# Patient Record
Sex: Male | Born: 1970 | Race: White | Hispanic: No | Smoking: Never smoker
Health system: Southern US, Community
[De-identification: ages and names within clinical notes are randomized; demographics above are authoritative.]

## PROBLEM LIST (undated history)

## (undated) DIAGNOSIS — K4090 Unilateral inguinal hernia, without obstruction or gangrene, not specified as recurrent: Secondary | ICD-10-CM

## (undated) DIAGNOSIS — N200 Calculus of kidney: Secondary | ICD-10-CM

## (undated) DIAGNOSIS — J45909 Unspecified asthma, uncomplicated: Secondary | ICD-10-CM

## (undated) DIAGNOSIS — K219 Gastro-esophageal reflux disease without esophagitis: Secondary | ICD-10-CM

## (undated) HISTORY — DX: Gastro-esophageal reflux disease without esophagitis: K21.9

## (undated) HISTORY — PX: CYSTOSCOPY: SUR368

## (undated) HISTORY — PX: TONSILLECTOMY: SUR1361

## (undated) HISTORY — DX: Unspecified asthma, uncomplicated: J45.909

## (undated) HISTORY — PX: SKIN BIOPSY: SHX1

## (undated) HISTORY — DX: Calculus of kidney: N20.0

## (undated) HISTORY — DX: Unilateral inguinal hernia, without obstruction or gangrene, not specified as recurrent: K40.90

---

## 2005-10-16 ENCOUNTER — Encounter: Admission: RE | Admit: 2005-10-16 | Discharge: 2005-10-16 | Payer: Self-pay | Admitting: Family Medicine

## 2006-03-09 ENCOUNTER — Ambulatory Visit (HOSPITAL_BASED_OUTPATIENT_CLINIC_OR_DEPARTMENT_OTHER): Admission: RE | Admit: 2006-03-09 | Discharge: 2006-03-09 | Payer: Self-pay | Admitting: General Surgery

## 2006-03-09 ENCOUNTER — Encounter (INDEPENDENT_AMBULATORY_CARE_PROVIDER_SITE_OTHER): Payer: Self-pay | Admitting: Specialist

## 2008-03-23 ENCOUNTER — Encounter: Admission: RE | Admit: 2008-03-23 | Discharge: 2008-03-23 | Payer: Self-pay | Admitting: Family Medicine

## 2011-01-08 ENCOUNTER — Encounter: Payer: Self-pay | Admitting: Family Medicine

## 2011-05-05 NOTE — Op Note (Signed)
NAMEKOLIN, Tyler Nelson            ACCOUNT NO.:  [id number]   MEDICAL RECORD NO.:  [id number]          PATIENT TYPE:  AMB   LOCATION:  DSC                          FACILITY:  [facility]   PHYSICIAN:  Anselm Pancoast. Weatherly, M.D.DATE OF BIRTH:  [birth year]   DATE OF PROCEDURE:  03/09/2006  DATE OF DISCHARGE:                                 OPERATIVE REPORT   PREOPERATIVE DIAGNOSIS:  Multiple lipomas.   POSTOPERATIVE DIAGNOSIS:  Multiple lipomas.   OPERATION:  Excision of multiple lipomas.  There is one on the lumbar area  of the back that may actually be a little sebaceous cyst. There was a larger  one about 3 cm on the right lateral chest, 2 cm right sternum, 2 cm anterior  right thigh, 2 cm right posterior thigh.   Tyler Nelson is a 40 year old male from whom I have removed previous  lipomas probably about five years ago.  He presented to the office with  multiple lipomas.  His family history, his mother has had many lipomas  excised by other surgeons.  He desired these to be removed and he was  originally scheduled for yesterday, but because of conflicting schedule he  returns today.  He had marked all of these various areas and the small one  on the back he said was the most symptomatic, but was not as large and I  elected to remove this one first.  Using a Betadine prep and then  anesthetizing the area with 1% Xylocaine with adrenaline, I made an incision  of the area and I think this one possibly was a little sebaceous cyst in  that it was more granular than the typical lipoma.  I then closed the  incision with simple sutures of 4-0 nylon.  Then we went to the right  lateral chest which is below the ribs which was larger and I anesthetized  this with about 5 or 6 mL of Xylocaine and then that was much larger and it  was actually two and it required suturing of figure-of-eight of 3-0 chromic.  The area was then closed with simple sutures of 4-0 nylon and it is a much  larger  defect.  Then there was a little small, 2 cm, just to the right of  the xiphoid that was removed in a similar manner and closed with 4-0 nylon.  There were two 2 cm areas, one anterior right thigh and one right posterior,  that was excised in the same manner. Betadine prep, Xylocaine and then  closed the skin.  The patient tolerated the procedure nicely and will be  released after a short stay.  I will see him in the office in probably about  7 to 10 days for suture removal.  He will be given Vicodin for pain.  I did  send the lesion on the back that may be a little sebaceous cyst separately  since it had a different appearance that the obvious lipomas that appeared  benign in the other areas.           ______________________________  Anselm Pancoast. Zachery Dakins, M.D.  WJW/MEDQ  D:  03/09/2006  T:  03/12/2006  Job:  [id number]

## 2015-02-05 ENCOUNTER — Ambulatory Visit (INDEPENDENT_AMBULATORY_CARE_PROVIDER_SITE_OTHER): Payer: Worker's Compensation | Admitting: Medical

## 2015-02-05 ENCOUNTER — Encounter: Payer: Self-pay | Admitting: Medical

## 2015-02-05 VITALS — BP 122/90 | HR 63 | Temp 97.7°F | Resp 16 | Ht 67.0 in | Wt 170.0 lb

## 2015-02-05 DIAGNOSIS — M545 Low back pain, unspecified: Secondary | ICD-10-CM

## 2015-02-05 DIAGNOSIS — M542 Cervicalgia: Secondary | ICD-10-CM

## 2015-02-05 MED ORDER — NAPROXEN 375 MG PO TABS: 375.0000 mg | ORAL_TABLET | Freq: Two times a day (BID) | ORAL | Status: DC

## 2015-02-05 MED ORDER — CYCLOBENZAPRINE HCL 10 MG PO TABS: ORAL_TABLET | ORAL | Status: DC
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
## 2015-02-25 ENCOUNTER — Other Ambulatory Visit: Payer: Self-pay | Admitting: Medical

## 2015-02-25 ENCOUNTER — Telehealth: Payer: Self-pay | Admitting: Medical

## 2015-02-25 ENCOUNTER — Ambulatory Visit (INDEPENDENT_AMBULATORY_CARE_PROVIDER_SITE_OTHER): Payer: Worker's Compensation | Admitting: Medical

## 2015-02-25 ENCOUNTER — Encounter: Payer: Self-pay | Admitting: Medical

## 2015-02-25 VITALS — BP 118/78 | HR 72 | Temp 97.8°F | Resp 15 | Wt 169.0 lb

## 2015-02-25 DIAGNOSIS — L609 Nail disorder, unspecified: Secondary | ICD-10-CM | POA: Diagnosis not present

## 2015-02-25 DIAGNOSIS — M545 Low back pain: Secondary | ICD-10-CM | POA: Diagnosis not present

## 2015-02-25 DIAGNOSIS — M542 Cervicalgia: Secondary | ICD-10-CM | POA: Diagnosis not present

## 2015-02-25 MED ORDER — CYCLOBENZAPRINE HCL 10 MG PO TABS: ORAL_TABLET | ORAL | Status: DC

## 2015-02-25 MED ORDER — NAPROXEN 375 MG PO TABS: 375.0000 mg | ORAL_TABLET | Freq: Two times a day (BID) | ORAL | Status: DC

## 2015-02-25 NOTE — Telephone Encounter (Signed)
Refer to dermatology.   Right thumb nail deformity since mashing in car door 3 mo ago.
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
## 2015-03-02 NOTE — Telephone Encounter (Signed)
Patient has an appointment at [facility] on 03/22/15 @ 240 pm with Marikay Alareana  [place] PA [address] [place], [place] [phone]

## 2015-03-04 NOTE — Telephone Encounter (Signed)
LM to [facility] 

## 2015-05-05 ENCOUNTER — Telehealth: Payer: Self-pay | Admitting: Internal Medicine

## 2015-05-05 NOTE — Telephone Encounter (Signed)
Faxed over medical records on 04/22/15 and mailed on  04/23/15 to [facility] [employer] @ [phone]

## 2015-06-16 ENCOUNTER — Encounter: Payer: Self-pay | Admitting: Medical

## 2015-07-12 ENCOUNTER — Telehealth: Payer: Self-pay | Admitting: Medical

## 2015-07-12 ENCOUNTER — Encounter: Payer: Self-pay | Admitting: Medical

## 2015-07-12 ENCOUNTER — Ambulatory Visit (INDEPENDENT_AMBULATORY_CARE_PROVIDER_SITE_OTHER): Payer: Worker's Compensation | Admitting: Medical

## 2015-07-12 ENCOUNTER — Ambulatory Visit
Admission: RE | Admit: 2015-07-12 | Discharge: 2015-07-12 | Disposition: A | Discharge status: Home / Self Care | Payer: Worker's Compensation | Source: Ambulatory Visit | Attending: Medical | Admitting: Medical

## 2015-07-12 VITALS — BP 110/80 | HR 72 | Temp 98.0°F | Resp 15 | Wt 166.0 lb

## 2015-07-12 DIAGNOSIS — M545 Low back pain: Secondary | ICD-10-CM

## 2015-07-12 DIAGNOSIS — R8290 Unspecified abnormal findings in urine: Secondary | ICD-10-CM

## 2015-07-12 DIAGNOSIS — M546 Pain in thoracic spine: Secondary | ICD-10-CM | POA: Diagnosis not present

## 2015-07-12 DIAGNOSIS — M549 Dorsalgia, unspecified: Secondary | ICD-10-CM

## 2015-07-12 LAB — POCT URINALYSIS DIPSTICK
BILIRUBIN UA: NEGATIVE
Blood, UA: NEGATIVE
Glucose, UA: NEGATIVE
KETONES UA: NEGATIVE
Leukocytes, UA: NEGATIVE
Nitrite, UA: NEGATIVE
Protein, UA: NEGATIVE
SPEC GRAV UA: 1.015
Urobilinogen, UA: NEGATIVE
pH, UA: 8

## 2015-07-12 MED ORDER — NAPROXEN 375 MG PO TABS: 375.0000 mg | ORAL_TABLET | Freq: Two times a day (BID) | ORAL | Status: DC

## 2015-07-12 MED ORDER — CYCLOBENZAPRINE HCL 10 MG PO TABS: ORAL_TABLET | ORAL | Status: DC

## 2015-07-12 MED ORDER — KETOROLAC TROMETHAMINE 60 MG/2ML IM SOLN
60.0000 mg | Freq: Once | INTRAMUSCULAR | Status: AC
  Administered 2015-07-12: 60 mg via INTRAMUSCULAR

## 2015-07-12 NOTE — Addendum Note (Signed)
Addended by: Jac Canavan on: 07/12/2015 11:45 AM   Modules accepted: Orders

## 2015-07-12 NOTE — Telephone Encounter (Signed)
Referral to physical therapy. 
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
## 2015-07-12 NOTE — Addendum Note (Signed)
Addended by: Leretha Dykes L on: 07/12/2015 10:40 AM   Modules accepted: Orders

## 2015-07-13 NOTE — Telephone Encounter (Signed)
[facility] - [facility]  Directions  Physical Therapy Clinic  Address: [address] [place], [place], [place] [address]  Phone: [phone]

## 2015-07-13 NOTE — Telephone Encounter (Signed)
Done

## 2015-07-14 LAB — URINE CULTURE

## 2015-08-11 ENCOUNTER — Ambulatory Visit: Payer: Worker's Compensation | Admitting: Medical

## 2015-09-29 ENCOUNTER — Telehealth: Payer: Self-pay

## 2015-09-29 NOTE — Telephone Encounter (Signed)
Medical Records sent [facility] to [facility] on 09/29/15

## 2015-09-29 NOTE — Telephone Encounter (Signed)
Medical records that were sent out on him today. The order for these was cancelled by the requestor.

## 2015-11-19 ENCOUNTER — Ambulatory Visit (INDEPENDENT_AMBULATORY_CARE_PROVIDER_SITE_OTHER): Payer: BLUE CROSS/BLUE SHIELD | Admitting: Medical

## 2015-11-19 ENCOUNTER — Encounter: Payer: Self-pay | Admitting: Medical

## 2015-11-19 VITALS — BP 120/88 | Temp 97.6°F | Wt 171.0 lb

## 2015-11-19 DIAGNOSIS — J0110 Acute frontal sinusitis, unspecified: Secondary | ICD-10-CM

## 2015-11-19 DIAGNOSIS — J988 Other specified respiratory disorders: Secondary | ICD-10-CM | POA: Diagnosis not present

## 2015-11-19 MED ORDER — AMOXICILLIN 500 MG PO TABS: ORAL_TABLET | ORAL | Status: DC

## 2015-11-19 MED ORDER — METHYLPREDNISOLONE ACETATE 40 MG/ML IJ SUSP
40.0000 mg | Freq: Once | INTRAMUSCULAR | Status: AC
  Administered 2015-11-19: 40 mg via INTRAMUSCULAR

## 2015-11-19 NOTE — Patient Instructions (Signed)
Medications prescribed today: Amoxicillin antibiotic Depo Medrol steroid injection given  Thank you for giving me the opportunity to serve you today.   Your diagnosis today includes: Encounter Diagnoses  Name Primary?  . Acute frontal sinusitis, recurrence not specified Yes  . Respiratory tract infection    Specific home care recommendations today include:  Only take over-the-counter (OTC) or prescription medicines for pain, discomfort, or fever as directed by your caregiver.    Decongestant: You may use OTC Guaifenesin (Mucinex plain) for congestion.  You may use Pseudoephedrine (Sudafed) only if you don't have blood pressure problems or a diagnosis of hypertension.  Cough suppression: If you have cough from drainage, you may use over-the-counter Dextromethorphan (Delsym) as directed on the label  Pain/fever relief: You may use over-the-counter Tylenol for pain or fever  Drink extra fluids. Fluids help thin the mucus so your sinuses can drain more easily.   Applying either moist heat or ice packs to the sinus areas may help relieve discomfort.  Use saline nasal sprays to help moisten your sinuses. The sprays can be found at your local drugstore.   Using Saline Nose Drops with Bulb Syringe A bulb syringe is used to clear your nose. You may use it when you have a stuffy nose, nasal congestion, sinus pressure, or sneezing.   SALINE SOLUTION You can buy nose drops at your local drug store. You can also make nose drops yourself. Mix 1 cup of water with  teaspoon of salt. Stir. Store this mixture at room temperature. Make a new batch daily.  USE THE BULB IN COMBINATION WITH SALINE NOSE DROPS  Squeeze the air out of the bulb before suctioning the saline mixture.  While still squeezing the bulb flat, place the tip of the bulb into the saline mixture.  Let air come back into the bulb.  This will suction up the saline mixture.  Gently flush one nostril at a time.  Salt water nose  drops will then moisten your  congested nose and loosen secretions before suctioning.  Use the bulb syringe as directed below to suction.  USING THE BULB SYRINGE TO SUCTION  While still squeezing the bulb flat, place the tip of the bulb into a nostril. Let air come back into the bulb. The suction will pull snot out of the nose and into the bulb.  Repeat on the other nostril.  Squeeze syringe several times into a tissue.  CLEANING THE BULB SYRINGE  Clean the bulb syringe every day with hot soapy water.  Clean the inside of the bulb by squeezing the bulb while the tip is in soapy water.  Rinse by squeezing the bulb while the tip is in clean hot water.  Store the bulb with the tip side down on paper towel.  HOME CARE INSTRUCTIONS   Use saline nose drops often to keep the nose open and not stuffy.  Throw away used salt water. Make a new solution every time.  Do not use the same solution and dropper for another person  If you do not prefer to use nasal saline flush, other options include nasal saline spray or the [employer], both of which are available over the counter at your pharmacy.   Please call or return if worse or not improving in the next few days.    Medication costs:  If you get to the pharmacy and medication prescribed today was either too expensive, not covered by your insurance, or required prior authorization, then please call us  back to let us know.  We often have no way to know if a medication is too expensive or not covered by your insurance.  Thanks for your cooperation.   Return if symptoms worsen or fail to improve.

## 2015-11-19 NOTE — Progress Notes (Signed)
Subjective:  Tyler Nelson is a 44 y.o. male who presents for congestion. Has had respiratory infection for 1+ week.  He notes head and chest congestion, some runny nose, some sneezing, stuffy, less appetite.   Maybe feels a little dyspneic but no wheezing or SOB.  Denies sore throat, ear pain, fever, nausea, vomiting.   Has some body aches.   He notes the past 1-2 years will get shot for respiratory infection.  Using OTC DM cough med, nyquil some.  Nonsmoker.  Has sick contacts.  No other aggravating or relieving factors.  No other c/o.  History reviewed. No pertinent past medical history.  ROS as in subjective   Objective: BP 120/88 mmHg  Temp(Src) 97.6 F (36.4 C) (Oral)  Wt 171 lb (77.565 kg)  General appearance: Alert, WD/WN, no distress                             Skin: warm, no rash                           Head: + frontal sinus tenderness,                            Eyes: conjunctiva normal, corneas clear, PERRLA                            Ears: flat left TM, pearly right TM, external ear canals normal                          Nose: septum midline, turbinates swollen, with erythema and clear discharge             Mouth/throat: MMM, tongue normal, mild pharyngeal erythema                           Neck: supple, no adenopathy, no thyromegaly, non tender                         Lungs: CTA bilaterally, no wheezes, rales, or rhonchi      Assessment  Encounter Diagnoses  Name Primary?  . Acute frontal sinusitis, recurrence not specified Yes  . Respiratory tract infection       Plan: Begin amoxicillin.   IM DepoMedrol 40mg  given in office.  discussed risks/benefits of medication.    Specific home care recommendations today include:  Only take over-the-counter (OTC) or prescription medicines for pain, discomfort, or fever as directed by your caregiver.    Decongestant: You may use OTC Guaifenesin (Mucinex plain) for congestion.  You may use Pseudoephedrine (Sudafed) only  if you don't have blood pressure problems or a diagnosis of hypertension.  Cough suppression: If you have cough from drainage, you may use over-the-counter Dextromethorphan (Delsym) as directed on the label  Pain/fever relief: You may use over-the-counter Tylenol for pain or fever  Drink extra fluids. Fluids help thin the mucus so your sinuses can drain more easily.   Applying either moist heat or ice packs to the sinus areas may help relieve discomfort.  Use saline nasal sprays to help moisten your sinuses. The sprays can be found at your local drugstore.   [name] was seen today for congestion.  Diagnoses and all orders for  this visit:  Acute frontal sinusitis, recurrence not specified  Respiratory tract infection  Other orders -     amoxicillin (AMOXIL) 500 MG tablet; 2 tablets po BID x 10 days   Patient was advised to call or return if worse or not improving in the next few days.    Patient voiced understanding of diagnosis, recommendations, and treatment plan.  After visit summary given.

## 2015-11-19 NOTE — Addendum Note (Signed)
Addended by: Kieth BrightlyLAWSON, Amaria Mundorf M on: 11/19/2015 09:37 AM   Modules accepted: Orders

## 2016-05-30 ENCOUNTER — Encounter: Payer: BLUE CROSS/BLUE SHIELD | Admitting: Medical

## 2016-05-30 DIAGNOSIS — Z0000 Encounter for general adult medical examination without abnormal findings: Secondary | ICD-10-CM

## 2016-06-02 ENCOUNTER — Encounter: Payer: Self-pay | Admitting: Medical

## 2016-06-02 ENCOUNTER — Telehealth: Payer: Self-pay

## 2016-06-02 NOTE — Telephone Encounter (Signed)
LMTCB
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
## 2016-06-02 NOTE — Telephone Encounter (Signed)
Send no show fee and D

## 2016-06-02 NOTE — Telephone Encounter (Signed)
[facility]

## 2016-06-05 NOTE — Telephone Encounter (Signed)
LMTCB

## 2016-07-03 ENCOUNTER — Telehealth: Payer: Self-pay | Admitting: Family Medicine

## 2016-07-03 NOTE — Telephone Encounter (Signed)
Pt came in to explain why he missed his last appt.  He states he tried to call us 2 different times to reschedule and couldn't leave message.  We rescheduled his CPE.

## 2016-08-04 ENCOUNTER — Encounter: Payer: Self-pay | Admitting: Medical

## 2016-08-04 ENCOUNTER — Ambulatory Visit (INDEPENDENT_AMBULATORY_CARE_PROVIDER_SITE_OTHER): Payer: BLUE CROSS/BLUE SHIELD | Admitting: Medical

## 2016-08-04 VITALS — BP 120/70 | HR 72 | Ht 66.5 in | Wt 167.0 lb

## 2016-08-04 DIAGNOSIS — Z23 Encounter for immunization: Secondary | ICD-10-CM | POA: Diagnosis not present

## 2016-08-04 DIAGNOSIS — Z0000 Encounter for general adult medical examination without abnormal findings: Secondary | ICD-10-CM | POA: Insufficient documentation

## 2016-08-04 LAB — LIPID PANEL
CHOL/HDL RATIO: 3.3 ratio (ref <=5.0)
Cholesterol: 198 mg/dL (ref 125–200)
HDL: 60 mg/dL (ref >=40)
LDL Cholesterol: 124 mg/dL (ref <130)
Triglycerides: 68 mg/dL (ref <150)
VLDL: 14 mg/dL (ref <30)

## 2016-08-04 LAB — COMPREHENSIVE METABOLIC PANEL
ALT: 17 U/L (ref 9–46)
AST: 19 U/L (ref 10–40)
Albumin: 4.2 g/dL (ref 3.6–5.1)
Alkaline Phosphatase: 46 U/L (ref 40–115)
BUN: 12 mg/dL (ref 7–25)
CO2: 29 mmol/L (ref 20–31)
Calcium: 9.6 mg/dL (ref 8.6–10.3)
Chloride: 103 mmol/L (ref 98–110)
Creat: 0.95 mg/dL (ref 0.60–1.35)
Glucose, Bld: 81 mg/dL (ref 65–99)
Potassium: 4.2 mmol/L (ref 3.5–5.3)
Sodium: 143 mmol/L (ref 135–146)
Total Bilirubin: 0.6 mg/dL (ref 0.2–1.2)
Total Protein: 6.6 g/dL (ref 6.1–8.1)

## 2016-08-04 LAB — CBC
HEMATOCRIT: 45.6 % (ref 38.5–50.0)
Hemoglobin: 15.3 g/dL (ref 13.2–17.1)
MCH: 31.2 pg (ref 27.0–33.0)
MCHC: 33.6 g/dL (ref 32.0–36.0)
MCV: 93.1 fL (ref 80.0–100.0)
MPV: 9.6 fL (ref 7.5–12.5)
Platelets: 290 10*3/uL (ref 140–400)
RBC: 4.9 MIL/uL (ref 4.20–5.80)
RDW: 14 % (ref 11.0–15.0)
WBC: 6.6 10*3/uL (ref 4.0–10.5)

## 2016-08-04 LAB — POCT URINALYSIS DIPSTICK
Bilirubin, UA: NEGATIVE
Blood, UA: NEGATIVE
Glucose, UA: NEGATIVE
Ketones, UA: NEGATIVE
Leukocytes, UA: NEGATIVE
Nitrite, UA: NEGATIVE
Protein, UA: NEGATIVE
Spec Grav, UA: 1.02
UROBILINOGEN UA: NEGATIVE
pH, UA: 7

## 2016-08-04 LAB — TSH: TSH: 1.64 mIU/L (ref 0.40–4.50)

## 2016-08-04 NOTE — Addendum Note (Signed)
Addended by: Kieth BrightlyLAWSON, Patryck Kilgore M on: 08/04/2016 09:51 AM   Modules accepted: Orders

## 2016-08-04 NOTE — Progress Notes (Signed)
Subjective:   HPI  Tyler Nelson is a 45 y.o. male who presents for a complete physical.  Medical care team includes:  Dentist  Ernst BreachYSINGER, DAVID SHANE, PA-C here for primary care   Concerns: Last tetanus around 2011  Reviewed their medical, surgical, family, social, medication, and allergy history and updated chart as appropriate.  Past Medical History:  Diagnosis Date  . GERD (gastroesophageal reflux disease)   . Inguinal hernia   . Reactive airway disease    prior occasional albuterol use, but no definite asthma history    Past Surgical History:  Procedure Laterality Date  . TONSILLECTOMY      Social History   Social History  . Marital status: Divorced    Spouse name: N/A  . Number of children: N/A  . Years of education: N/A   Occupational History  . Not on file.   Social History Main Topics  . Smoking status: Never Smoker  . Smokeless tobacco: Never Used  . Alcohol use No  . Drug use: No  . Sexual activity: Not on file   Other Topics Concern  . Not on file   Social History Narrative   Separated, has 3 adopted kids from [place].  Exercise - walking, always on the move.  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    Family History  Problem Relation Age of Onset  . Hyperlipidemia Mother   . Hypertension Father   . Diabetes Maternal Grandmother   . Cancer Maternal Grandfather     leukemia  . Heart disease Maternal Grandfather 70  . Cancer Paternal Grandfather [phone]    prostate  . Stroke Neg Hx     No current outpatient prescriptions on file.  Allergies  Allergen Reactions  . Codeine     Review of Systems Constitutional: -fever, -chills, -sweats, -unexpected weight change, -decreased appetite, -fatigue Allergy: -sneezing, -itching, -congestion Dermatology: -changing moles, --rash, -lumps ENT: -runny nose, -ear pain, -sore throat, -hoarseness, -sinus pain, -teeth pain, - ringing in ears, -hearing loss, -nosebleeds Cardiology: -chest pain, -palpitations,  -swelling, -difficulty breathing when lying flat, -waking up short of breath Respiratory: -cough, -shortness of breath, -difficulty breathing with exercise or exertion, -wheezing, -coughing up blood Gastroenterology: -abdominal pain, -nausea, -vomiting, -diarrhea, -constipation, -blood in stool, -changes in bowel movement, -difficulty swallowing or eating Hematology: -bleeding, -bruising  Musculoskeletal: -joint aches, -muscle aches, -joint swelling, -back pain, -neck pain, -cramping, -changes in gait Ophthalmology: denies vision changes, eye redness, itching, discharge Urology: -burning with urination, -difficulty urinating, -blood in urine, -urinary frequency, -urgency, -incontinence Neurology: -headache, -weakness, -tingling, -numbness, -memory loss, -falls, -dizziness Psychology: -depressed mood, -agitation, -sleep problems     Objective:   Physical Exam  BP 120/70   Pulse 72   Ht 5' 6.5" (1.689 m)   Wt 167 lb (75.8 kg)   BMI 26.55 kg/m   General appearance: alert, no distress, WD/WN, white male Skin: scattered macules, no worrisome lesions.   Right lateral flank area with 3cm linear biopsy scar HEENT: normocephalic, conjunctiva/corneas normal, sclerae anicteric, PERRLA, EOMi, nares patent, no discharge or erythema, pharynx normal Oral cavity: MMM, tongue normal, teeth normal Neck: supple, no lymphadenopathy, no thyromegaly, no masses, normal ROM Chest: non tender, normal shape and expansion Heart: RRR, normal S1, S2, no murmurs Lungs: CTA bilaterally, no wheezes, rhonchi, or rales Abdomen: +bs, soft, non tender, non distended, no masses, no hepatomegaly, no splenomegaly, no bruits Back: non tender, normal ROM, no scoliosis Musculoskeletal: upper extremities non tender, no obvious deformity, normal ROM throughout, lower extremities  non tender, no obvious deformity, normal ROM throughout Extremities: no edema, no cyanosis, no clubbing Pulses: 2+ symmetric, upper and lower  extremities, normal cap refill Neurological: alert, oriented x 3, CN2-12 intact, strength normal upper extremities and lower extremities, sensation normal throughout, DTRs 2+ throughout, no cerebellar signs, gait normal Psychiatric: normal affect, behavior normal, pleasant  GU: normal male external genitalia, circumcised, nontender, no masses, no hernia, no lymphadenopathy Rectal: deferred   Assessment and Plan :    Encounter Diagnoses  Name Primary?  . Encounter for health maintenance examination in adult Yes  . Need for prophylactic vaccination and inoculation against influenza    Physical exam - discussed healthy lifestyle, diet, exercise, preventative care, vaccinations, and addressed their concerns.   See your eye doctor yearly for routine vision care. See your dentist yearly for routine dental care including hygiene visits twice yearly. Advised monthly testicular exam Advised routine physical f/u Counseled on the influenza virus vaccine.  Vaccine information sheet given.  Influenza vaccine given after consent obtained. Declines STD screen, 1 prior sexual partner (ex-wife) and they were both virgins per his report Advised he get me copy of 2011 Tdap. Follow-up pending labs

## 2016-08-04 NOTE — Addendum Note (Signed)
Addended by: Kieth BrightlyLAWSON, Geddy Boydstun M on: 08/04/2016 09:47 AM   Modules accepted: Orders

## 2016-08-05 LAB — HEMOGLOBIN A1C
HEMOGLOBIN A1C: 5.2 % (ref <5.7)
Mean Plasma Glucose: 103 mg/dL

## 2016-11-03 ENCOUNTER — Encounter (HOSPITAL_COMMUNITY): Payer: Self-pay | Admitting: Family Medicine

## 2016-11-03 ENCOUNTER — Ambulatory Visit (HOSPITAL_COMMUNITY)
Admission: EM | Admit: 2016-11-03 | Discharge: 2016-11-03 | Disposition: A | Discharge status: Home / Self Care | Payer: BLUE CROSS/BLUE SHIELD | Attending: Family Medicine | Admitting: Family Medicine

## 2016-11-03 DIAGNOSIS — H1132 Conjunctival hemorrhage, left eye: Secondary | ICD-10-CM | POA: Diagnosis not present

## 2016-11-03 NOTE — ED Triage Notes (Signed)
Pt  Reports  Redness  And  Irritation  Of  The   l  Side  Of  The  l  Eye          denys   Any  Injury       Noticed   sev  Hours   Ago       Got     Slightly     Lightheaded     At  The  Time

## 2016-11-03 NOTE — ED Provider Notes (Signed)
[facility]    CSN: [id number] Arrival date & time: 11/03/16  1758     History [id number]  Chief Complaint Chief Complaint  Patient presents with  . Eye Problem    HPI Tyler Nelson is a 45 y.o. male.   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 He then felt lightheaded. His daughter told him that she thought he had a small bleed in his eyes.  Patient has no change in his vision, no eye pain.  Patient does take several aspirin a day      Past Medical History:  Diagnosis Date  . GERD (gastroesophageal reflux disease)   . Inguinal hernia   . Reactive airway disease    prior occasional albuterol use, but no definite asthma history    Patient Active Problem List   Diagnosis Date Noted  . Encounter for health maintenance examination in adult 08/04/2016    Past Surgical History:  Procedure Laterality Date  . SKIN BIOPSY    . TONSILLECTOMY         Home Medications    Prior to Admission medications   Not on File    Family History Family History  Problem Relation Age of Onset  . Hyperlipidemia Mother   . Hypertension Father   . Diabetes Maternal Grandmother   . Cancer Maternal Grandfather     leukemia  . Heart disease Maternal Grandfather 70  . Cancer Paternal Grandfather [phone]    prostate  . Stroke Neg Hx     Social History Social History  Substance Use Topics  . Smoking status: Never Smoker  . Smokeless tobacco: Never Used  . Alcohol use No     Allergies   Codeine   Review of Systems Review of Systems  Constitutional: Negative.   HENT: Negative.   Eyes: Positive for redness. Negative for photophobia, pain, discharge, itching and visual disturbance.  Skin: Negative.      Physical Exam Triage Vital Signs ED Triage Vitals  Enc Vitals Group     BP      Pulse      Resp      Temp      Temp src      SpO2      Weight      Height      Head Circumference    Peak Flow      Pain Score      Pain Loc      Pain Edu?      Excl. in GC?    No data found.   Updated Vital Signs BP 106/82 (BP Location: Right Arm)   Pulse 78   Temp 98.6 F (37 C)   Resp 18   SpO2 100%    Physical Exam  Constitutional: He is oriented to person, place, and time. He appears well-developed and well-nourished.  HENT:  Head: Normocephalic.  Right Ear: External ear normal.  Left Ear: External ear normal.  Mouth/Throat: Oropharynx is clear and moist.  Eyes: EOM are normal. Pupils are equal, round, and reactive to light.  Small subconjunctival hemorrhage in the left thigh laterally  Neck: Normal range of motion. Neck supple.  Musculoskeletal: Normal range of motion.  Neurological: He is alert and oriented to person, place, and time.  Skin: Skin is warm and dry.     UC Treatments / Results  Labs (all labs ordered are listed, but only abnormal results are displayed)  Labs Reviewed - No data to display  EKG  EKG Interpretation None       Radiology No results found.  Procedures Procedures (including critical care time)  Medications Ordered in UC Medications - No data to display   Initial Impression / Assessment and Plan / UC Course  I have reviewed the triage vital signs and the nursing notes.  Pertinent labs & imaging results that were available during my care of the patient were reviewed by me and considered in my medical decision making (see chart for details).  Clinical Course      Final Clinical Impressions(s) / UC Diagnoses   Final diagnoses:  Subconjunctival hemorrhage of left eye    New Prescriptions New Prescriptions   No medications on file     Elvina SidleKurt Shaheer Bonfield, MD 11/03/16 [phone]

## 2017-06-19 ENCOUNTER — Ambulatory Visit
Admission: RE | Admit: 2017-06-19 | Discharge: 2017-06-19 | Disposition: A | Discharge status: Home / Self Care | Payer: BLUE CROSS/BLUE SHIELD | Source: Ambulatory Visit | Attending: Medical | Admitting: Medical

## 2017-06-19 ENCOUNTER — Encounter: Payer: Self-pay | Admitting: Medical

## 2017-06-19 ENCOUNTER — Ambulatory Visit (INDEPENDENT_AMBULATORY_CARE_PROVIDER_SITE_OTHER): Payer: BLUE CROSS/BLUE SHIELD | Admitting: Medical

## 2017-06-19 VITALS — BP 130/72 | HR 68 | Wt 165.8 lb

## 2017-06-19 DIAGNOSIS — M25362 Other instability, left knee: Secondary | ICD-10-CM | POA: Diagnosis not present

## 2017-06-19 DIAGNOSIS — R0789 Other chest pain: Secondary | ICD-10-CM

## 2017-06-19 DIAGNOSIS — R0602 Shortness of breath: Secondary | ICD-10-CM | POA: Diagnosis not present

## 2017-06-19 DIAGNOSIS — M25562 Pain in left knee: Principal | ICD-10-CM

## 2017-06-19 DIAGNOSIS — G8929 Other chronic pain: Secondary | ICD-10-CM

## 2017-06-19 MED ORDER — FLUTICASONE-SALMETEROL 100-50 MCG/DOSE IN AEPB: 1.0000 | INHALATION_SPRAY | Freq: Two times a day (BID) | RESPIRATORY_TRACT | 0 refills | Status: DC

## 2017-06-19 NOTE — Progress Notes (Signed)
Subjective: Chief Complaint  Patient presents with  . Knee Pain    lt knee pain , feels like going to give away on him., discuss having stress test    Here for left knee pain.   Sometimes left knee feels like it could give way.   Been having some knee issues the past 6 months.   No specific injury, trauma or fall.  No swelling of left knee.   At times gets pain in the left knee.   Walks a lot, runs some, but no recent twisting turning injury.    Curious if he needs to have stress test. Sometimes has unusual feeling in left chest, "around his heart."   Curious if its stress related.  Sometimes feels lightheaded.  No palpations, no SOB specifically or DOE.   He does have hx/o asthma in the past, but no use of inhaler in year or more.  No other aggravating or relieving factors. No other complaint.  Past Medical History:  Diagnosis Date  . GERD (gastroesophageal reflux disease)   . Inguinal hernia   . Reactive airway disease    prior occasional albuterol use, but no definite asthma history   No current outpatient prescriptions on file prior to visit.   No current facility-administered medications on file prior to visit.    Family History  Problem Relation Age of Onset  . Hyperlipidemia Mother   . Hypertension Father   . Diabetes Maternal Grandmother   . Cancer Maternal Grandfather        leukemia  . Heart disease Maternal Grandfather 70  . Cancer Paternal Grandfather 77       prostate  . Stroke Neg Hx    ROS as in subjective   Objective: BP 130/72   Pulse 68   Wt 165 lb 12.8 oz (75.2 kg)   SpO2 97%   BMI 26.36 kg/m   Wt Readings from Last 3 Encounters:  06/19/17 165 lb 12.8 oz (75.2 kg)  08/04/16 167 lb (75.8 kg)  11/19/15 171 lb (77.6 kg)    General appearance: alert, no distress, WD/WN,  HEENT: normocephalic, sclerae anicteric, PERRLA, EOMi, nares patent, no discharge or erythema, pharynx normal Oral cavity: MMM, no lesions Neck: supple, no lymphadenopathy, no  thyromegaly, no masses, no bruits Heart: RRR, normal S1, S2, no murmurs Lungs: CTA bilaterally, no wheezes, rhonchi, or rales Abdomen: +bs, soft, non tender, non distended, no masses, no hepatomegaly, no splenomegaly Musculoskeletal: seems to be somewhat lax with anterior drawer of both knees, but not worse left, mild medial joint line of left knee, othewise non tender, no swelling, no obvious deformity Extremities: no edema, no cyanosis, no clubbing Pulses: 2+ symmetric, upper and lower extremities, normal cap refill Neurological: alert, oriented x 3, CN2-12 intact, strength normal upper extremities and lower extremities, sensation normal throughout, DTRs 2+ throughout, no cerebellar signs, gait normal    Adult ECG Report  Indication: chest discomfort  Rate: 60 bpm  Rhythm: normal sinus rhythm  QRS Axis: 61 degrees  PR Interval:  QRS Duration:  QTc:  Conduction Disturbances:incomplete right bundle branch block  Other Abnormalities: none  Patient's cardiac risk factors are: male gender.  EKG comparison: none  Narrative Interpretation: incomplete RBBB  PFT abnormal today showing mild airway obstruction   Assessment: Encounter Diagnoses  Name Primary?  . Chronic pain of left knee Yes  . Knee gives out, left   . Chest discomfort      Plan: Chronic knee pain, weakness -  laxity of anterior drawer, but not asymmetrical. Go for xray.   Consider PT vs ortho referral.   Chest discomfort - likely mild asthma related given the PFT today and hx/o reported abnormal PFT within past 2 years at another office.  Begin trial of Advair, discussed proper use of medication.   Call report within 2 wk.  Tyler Nelson was seen today for knee pain.  Diagnoses and all orders for this visit:  Chronic pain of left knee -     DG Knee Complete 4 Views Left; Future  Knee gives out, left -     DG Knee Complete 4 Views Left; Future  Chest discomfort -     EKG 12-Lead -      Spirometry with Graph  Other orders -     Fluticasone-Salmeterol (ADVAIR) 100-50 MCG/DOSE AEPB; Inhale 1 puff into the lungs 2 (two) times daily.

## 2017-07-17 ENCOUNTER — Telehealth: Payer: Self-pay | Admitting: Medical

## 2017-07-17 NOTE — Telephone Encounter (Signed)
Pt emailed forms that need to be filled out, put in your folder, pt last cpe was 08/04/2017 he needs these forms before 08/01/2017 please give back to me,

## 2017-07-17 NOTE — Telephone Encounter (Signed)
Put forms in tarsha folder for you

## 2017-07-18 NOTE — Telephone Encounter (Signed)
Called and l/m for pt to bring us a copy of his shot record by the office to fill out forms, looked in ncir  No records found.

## 2017-07-20 ENCOUNTER — Other Ambulatory Visit: Payer: Self-pay | Admitting: Medical

## 2017-07-20 DIAGNOSIS — Z139 Encounter for screening, unspecified: Secondary | ICD-10-CM

## 2017-07-21 ENCOUNTER — Other Ambulatory Visit: Payer: Self-pay | Admitting: Medical

## 2017-07-21 LAB — HEPATITIS B SURFACE ANTIGEN: Hepatitis B Surface Ag: NONREACTIVE

## 2017-07-23 ENCOUNTER — Other Ambulatory Visit: Payer: Self-pay | Admitting: Medical

## 2017-07-23 ENCOUNTER — Telehealth: Payer: Self-pay | Admitting: Medical

## 2017-07-23 ENCOUNTER — Other Ambulatory Visit: Payer: BLUE CROSS/BLUE SHIELD

## 2017-07-23 LAB — MEASLES/MUMPS/RUBELLA IMMUNITY
Mumps IgG: 172 AU/mL — ABNORMAL HIGH (ref <9.00)
Rubella: 5.81 Index — ABNORMAL HIGH (ref <0.90)
Rubeola IgG: 97.6 AU/mL — ABNORMAL HIGH (ref <25.00)

## 2017-07-23 NOTE — Telephone Encounter (Signed)
The Hep B test is not the right test .  Please have Sandy add Hep B surface antibody.  This is the immunity test.

## 2017-07-23 NOTE — Telephone Encounter (Signed)
Gave to sandy

## 2017-07-24 LAB — HEPATITIS B SURFACE ANTIBODY, QUANTITATIVE: Hep B S AB Quant (Post): <5 m[IU]/mL — ABNORMAL LOW (ref >=10)

## 2017-07-24 NOTE — Telephone Encounter (Signed)
Pt stopped by the office and wanted to know if labs for   Hep b ,mmr,polio  have come in . I told him that the hep b  Came in that he needed have 3 shots series  ,however the polio has not come in.  Per shane he can come in to get the shots for the hep b and tdap . And he will write a letter about mmr and polio. Pt said that he will wait on the hep b and tdap . That college is only concern about his mmr and polio . Can pt please have a letter about his mmr and polio to turn in to college today.

## 2017-07-25 NOTE — Telephone Encounter (Signed)
So I she going to get the Tdap too?  We either have to have proof of the Tdap from 2011 or redo the shot if he doesn't have the proof

## 2017-07-26 NOTE — Telephone Encounter (Signed)
Pt said that he didn't  Want to get the tdap ,he said that he didn't need it

## 2017-07-27 NOTE — Telephone Encounter (Signed)
Still pending polio lab

## 2017-07-28 LAB — POLIOVIRUS (1,3) ABS, NEUTRALIZ.
Polio 1 Titer: 1:64 {titer}
Polio 3 Titer: >1:128 {titer}

## 2017-08-03 ENCOUNTER — Telehealth: Payer: Self-pay | Admitting: Medical

## 2017-08-03 NOTE — Telephone Encounter (Signed)
Advised pt that physical exam & immunization form for college is ready for pick up

## 2018-02-14 ENCOUNTER — Encounter: Payer: Self-pay | Admitting: Medical

## 2018-02-14 ENCOUNTER — Ambulatory Visit (INDEPENDENT_AMBULATORY_CARE_PROVIDER_SITE_OTHER): Payer: BLUE CROSS/BLUE SHIELD | Admitting: Medical

## 2018-02-14 VITALS — BP 128/66 | HR 80 | Ht 66.0 in | Wt 162.2 lb

## 2018-02-14 DIAGNOSIS — R5383 Other fatigue: Secondary | ICD-10-CM | POA: Diagnosis not present

## 2018-02-14 DIAGNOSIS — R351 Nocturia: Secondary | ICD-10-CM | POA: Diagnosis not present

## 2018-02-14 DIAGNOSIS — R202 Paresthesia of skin: Secondary | ICD-10-CM | POA: Diagnosis not present

## 2018-02-14 DIAGNOSIS — Z0000 Encounter for general adult medical examination without abnormal findings: Secondary | ICD-10-CM | POA: Diagnosis not present

## 2018-02-14 LAB — POCT URINALYSIS DIP (PROADVANTAGE DEVICE)
BILIRUBIN UA: NEGATIVE
BILIRUBIN UA: NEGATIVE mg/dL
Glucose, UA: NEGATIVE mg/dL
LEUKOCYTES UA: NEGATIVE
Nitrite, UA: NEGATIVE
PH UA: 6 (ref 5.0–8.0)
Protein Ur, POC: NEGATIVE mg/dL
Specific Gravity, Urine: 1.025
Urobilinogen, Ur: NEGATIVE

## 2018-02-14 NOTE — Progress Notes (Signed)
Subjective:   HPI  Tyler Nelson is a 47 y.o. male who presents for physical Chief Complaint  Patient presents with  . Annual Exam    physical , freq urination , discuss dm    Medical care team includes: Edris Schneck, Kermit Balo, PA-C here for primary care Dentist Eye doctor  Hx/o GERD - no recent issues  Does get some urinary issues.  Some nocturia, 1-2 times per night on some nights, some urinary post void dribbling, some urinary hesitancy.     Gets some tingling in calves at times.   Reviewed their medical, surgical, family, social, medication, and allergy history and updated chart as appropriate.  Past Medical History:  Diagnosis Date  . GERD (gastroesophageal reflux disease)   . Inguinal hernia   . Reactive airway disease    prior occasional albuterol use, but no definite asthma history    Past Surgical History:  Procedure Laterality Date  . SKIN BIOPSY    . TONSILLECTOMY      Social History   Socioeconomic History  . Marital status: Divorced    Spouse name: Not on file  . Number of children: Not on file  . Years of education: Not on file  . Highest education level: Not on file  Social Needs  . Financial resource strain: Not on file  . Food insecurity - worry: Not on file  . Food insecurity - inability: Not on file  . Transportation needs - medical: Not on file  . Transportation needs - non-medical: Not on file  Occupational History  . Not on file  Tobacco Use  . Smoking status: Never Smoker  . Smokeless tobacco: Never Used  Substance and Sexual Activity  . Alcohol use: No    Alcohol/week: 0.0 oz  . Drug use: No  . Sexual activity: Not on file  Other Topics Concern  . Not on file  Social History Narrative   Separated, has 3 adopted kids from [place].  Exercise - walking, always on the move.  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 Hyperlipidemia Mother   . Hypertension Father   . Diabetes Maternal  Grandmother   . Cancer Maternal Grandfather        leukemia  . Heart disease Maternal Grandfather 70  . Cancer Paternal Grandfather 8       prostate  . Stroke Neg Hx      Current Outpatient Medications:  .  Fluticasone-Salmeterol (ADVAIR) 100-50 MCG/DOSE AEPB, Inhale 1 puff into the lungs 2 (two) times daily., Disp: 1 each, Rfl: 0 .  Glucosamine-Chondroit-Vit C-Mn (GLUCOSAMINE 1500 COMPLEX PO), Take by mouth., Disp: , Rfl:   Allergies  Allergen Reactions  . Codeine     Review of Systems Constitutional: -fever, -chills, -sweats, -unexpected weight change, -decreased appetite, -fatigue Allergy: -sneezing, -itching, -congestion Dermatology: -changing moles, --rash, -lumps ENT: -runny nose, -ear pain, -sore throat, -hoarseness, -sinus pain, -teeth pain, - ringing in ears, -hearing loss, -nosebleeds Cardiology: -chest pain, -palpitations, -swelling, -difficulty breathing when lying flat, -waking up short of breath Respiratory: -cough, -shortness of breath, -difficulty breathing with exercise or exertion, -wheezing, -coughing up blood Gastroenterology: -abdominal pain, -nausea, -vomiting, -diarrhea, -constipation, -blood in stool, -changes in bowel movement, -difficulty swallowing or eating Hematology: -bleeding, -bruising  Musculoskeletal: -joint aches, -muscle aches, -joint swelling, -back pain, -neck pain, -cramping, -changes in gait Ophthalmology: denies vision changes, eye redness, itching, discharge Urology: -burning with urination, +difficulty urinating, -blood in urine, +urinary  frequency, -urgency, -incontinence Neurology: -headache, -weakness, +tingling, -numbness, -memory loss, -falls, -dizziness Psychology: -depressed mood, -agitation, -sleep problems     Objective:   BP 128/66   Pulse 80   Ht 5\' 6"  (1.676 m)   Wt 162 lb 3.2 oz (73.6 kg)   SpO2 98%   BMI 26.18 kg/m   General appearance: alert, no distress, WD/WN, Caucasian male Skin: unremarkable HEENT:  normocephalic, conjunctiva/corneas normal, sclerae anicteric, PERRLA, EOMi, nares patent, no discharge or erythema, pharynx normal Oral cavity: MMM, tongue normal, teeth normal Neck: supple, no lymphadenopathy, no thyromegaly, no masses, normal ROM, no bruits Chest: non tender, normal shape and expansion Heart: RRR, normal S1, S2, no murmurs Lungs: CTA bilaterally, no wheezes, rhonchi, or rales Abdomen: +bs, soft, non tender, non distended, no masses, no hepatomegaly, no splenomegaly, no bruits Back: non tender, normal ROM, no scoliosis Musculoskeletal: upper extremities non tender, no obvious deformity, normal ROM throughout, lower extremities non tender, no obvious deformity, normal ROM throughout Extremities: no edema, no cyanosis, no clubbing Pulses: 2+ symmetric, upper and lower extremities, normal cap refill Neurological: alert, oriented x 3, CN2-12 intact, strength normal upper extremities and lower extremities, sensation normal throughout, DTRs 2+ throughout, no cerebellar signs, gait normal Psychiatric: normal affect, behavior normal, pleasant  GU: normal male external genitalia,circumcised, nontender, no masses, no hernia, no lymphadenopathy Rectal: anus normal tone, prostate mildly elevated   Assessment and Plan :    Encounter Diagnoses  Name Primary?  . Encounter for health maintenance examination in adult Yes  . Paresthesia   . Nocturia   . Fatigue, unspecified type     Physical exam - discussed and counseled on healthy lifestyle, diet, exercise, preventative care, vaccinations, sick and well care, proper use of emergency dept and after hours care, and addressed their concerns.    Health screening: See your eye doctor yearly for routine vision care. See your dentist yearly for routine dental care including hygiene visits twice yearly.  Cancer screening Discussed colonoscopy screening age 46yo unless higher risk for earlier screening Discussed PSA, prostate exam, and  prostate cancer screening risks/benefits.   Discussed prostate symptoms as well.  Prostate screening performed: Yes  Vaccinations: Counseled on the following vaccines:  Influenza Td updated in 2011 per patient  Separate significant chronic issues discussed: Nocturia - likely related to BPH.  discussed symptoms, possible treatment  paresthesia - unclear etiology, normal exam  [name] was seen today for annual exam.  Diagnoses and all orders for this visit:  Encounter for health maintenance examination in adult -     CBC with Differential/Platelet -     Comprehensive metabolic panel -     Lipid panel -     PSA -     Hemoglobin A1c -     POCT Urinalysis DIP (Proadvantage Device) -     Testosterone  Paresthesia -     Vitamin B12  Nocturia -     PSA  Fatigue, unspecified type -     Testosterone   Follow-up pending labs, yearly for physical

## 2018-02-15 ENCOUNTER — Telehealth: Payer: Self-pay | Admitting: Medical

## 2018-02-15 LAB — CBC WITH DIFFERENTIAL/PLATELET
Basophils Absolute: 0 10*3/uL (ref 0.0–0.2)
Basos: 1 %
EOS (ABSOLUTE): 0.1 10*3/uL (ref 0.0–0.4)
Eos: 2 %
Hematocrit: 44.6 % (ref 37.5–51.0)
Hemoglobin: 15.2 g/dL (ref 13.0–17.7)
Immature Grans (Abs): 0 10*3/uL (ref 0.0–0.1)
Immature Granulocytes: 0 %
Lymphocytes Absolute: 2.2 10*3/uL (ref 0.7–3.1)
Lymphs: 38 %
MCH: 31.4 pg (ref 26.6–33.0)
MCHC: 34.1 g/dL (ref 31.5–35.7)
MCV: 92 fL (ref 79–97)
Monocytes Absolute: 0.7 10*3/uL (ref 0.1–0.9)
Monocytes: 11 %
Neutrophils Absolute: 2.8 10*3/uL (ref 1.4–7.0)
Neutrophils: 48 %
Platelets: 304 10*3/uL (ref 150–379)
RBC: 4.84 x10E6/uL (ref 4.14–5.80)
RDW: 13.8 % (ref 12.3–15.4)
WBC: 5.8 10*3/uL (ref 3.4–10.8)

## 2018-02-15 LAB — COMPREHENSIVE METABOLIC PANEL
ALT: 19 IU/L (ref 0–44)
AST: 20 IU/L (ref 0–40)
Albumin/Globulin Ratio: 2.2 (ref 1.2–2.2)
Albumin: 4.7 g/dL (ref 3.5–5.5)
Alkaline Phosphatase: 51 IU/L (ref 39–117)
BUN/Creatinine Ratio: 13 (ref 9–20)
BUN: 12 mg/dL (ref 6–24)
Bilirubin Total: 0.5 mg/dL (ref 0.0–1.2)
CO2: 25 mmol/L (ref 20–29)
Calcium: 9.7 mg/dL (ref 8.7–10.2)
Chloride: 102 mmol/L (ref 96–106)
Creatinine, Ser: 0.96 mg/dL (ref 0.76–1.27)
GFR calc Af Amer: 109 mL/min/{1.73_m2} (ref >59)
GFR calc non Af Amer: 94 mL/min/{1.73_m2} (ref >59)
Globulin, Total: 2.1 g/dL (ref 1.5–4.5)
Glucose: 85 mg/dL (ref 65–99)
Potassium: 4.1 mmol/L (ref 3.5–5.2)
Sodium: 143 mmol/L (ref 134–144)
Total Protein: 6.8 g/dL (ref 6.0–8.5)

## 2018-02-15 LAB — HEMOGLOBIN A1C
ESTIMATED AVERAGE GLUCOSE: 111 mg/dL
HEMOGLOBIN A1C: 5.5 % (ref 4.8–5.6)

## 2018-02-15 LAB — VITAMIN B12: Vitamin B-12: 341 pg/mL (ref 232–1245)

## 2018-02-15 LAB — PSA: PROSTATE SPECIFIC AG, SERUM: 0.6 ng/mL (ref 0.0–4.0)

## 2018-02-15 LAB — TESTOSTERONE: Testosterone: 452 ng/dL (ref 264–916)

## 2018-02-15 LAB — LIPID PANEL
Chol/HDL Ratio: 3.2 ratio (ref 0.0–5.0)
Cholesterol, Total: 186 mg/dL (ref 100–199)
HDL: 59 mg/dL (ref >39)
LDL Calculated: 116 mg/dL — ABNORMAL HIGH (ref 0–99)
Triglycerides: 55 mg/dL (ref 0–149)
VLDL Cholesterol Cal: 11 mg/dL (ref 5–40)

## 2018-02-15 NOTE — Telephone Encounter (Signed)
He has microscopic blood in urine.  Lets have him return for clean catch UA at his convenience to verify.

## 2018-02-18 ENCOUNTER — Other Ambulatory Visit (INDEPENDENT_AMBULATORY_CARE_PROVIDER_SITE_OTHER): Payer: BLUE CROSS/BLUE SHIELD

## 2018-02-18 DIAGNOSIS — Z87448 Personal history of other diseases of urinary system: Secondary | ICD-10-CM

## 2018-02-18 LAB — POCT URINALYSIS DIP (PROADVANTAGE DEVICE)
BILIRUBIN UA: NEGATIVE
BILIRUBIN UA: NEGATIVE mg/dL
Blood, UA: NEGATIVE
Glucose, UA: NEGATIVE mg/dL
LEUKOCYTES UA: NEGATIVE
Nitrite, UA: NEGATIVE
PROTEIN UA: NEGATIVE mg/dL
SPECIFIC GRAVITY, URINE: 1.015
Urobilinogen, Ur: NEGATIVE
pH, UA: 7 (ref 5.0–8.0)

## 2018-02-18 NOTE — Telephone Encounter (Signed)
Called and spoke with pt is coming in on 02/19/2018 .

## 2018-02-19 ENCOUNTER — Other Ambulatory Visit: Payer: Self-pay

## 2018-06-25 ENCOUNTER — Telehealth: Payer: Self-pay | Admitting: Medical

## 2018-06-25 NOTE — Telephone Encounter (Signed)
Pt came by and stated that he was recently seen by [facility]. Pt states things did show up that he is following up on. While pt here I had him sign a medical records request and those records came in today. Sending back for review.

## 2018-07-17 ENCOUNTER — Telehealth: Payer: Self-pay | Admitting: Medical

## 2018-07-17 NOTE — Telephone Encounter (Signed)
Received requested records from [facility]. Sending back for review.  °

## 2018-11-22 ENCOUNTER — Institutional Professional Consult (permissible substitution): Payer: Self-pay | Admitting: Internal Medicine

## 2018-12-02 ENCOUNTER — Ambulatory Visit: Payer: BLUE CROSS/BLUE SHIELD | Admitting: Internal Medicine

## 2018-12-02 ENCOUNTER — Encounter: Payer: Self-pay | Admitting: Internal Medicine

## 2018-12-02 VITALS — BP 140/78 | HR 95 | Ht 67.0 in | Wt 161.8 lb

## 2018-12-02 DIAGNOSIS — J948 Other specified pleural conditions: Secondary | ICD-10-CM | POA: Diagnosis not present

## 2018-12-02 DIAGNOSIS — R0789 Other chest pain: Secondary | ICD-10-CM

## 2018-12-02 DIAGNOSIS — R0602 Shortness of breath: Secondary | ICD-10-CM | POA: Diagnosis not present

## 2018-12-02 NOTE — Patient Instructions (Addendum)
Classic subdiaphragmatic pain pattern suggests ibs:  Stereotypical,   with a very limited distribution of pain locations, daytime, not usually exacerbated by exercise  or coughing, worse in sitting position, frequently associated with generalized abd bloating, not as likely while sleeping  due to the dome effect of the diaphragm which  is  canceled in that position. Frequently these patients have had multiple negative GI workups and CT scans.  Treatment consists of avoiding foods that cause gas (especially boiled eggs, mexcican food but especially  beans and undercooked vegetables like  spinach and some salads)  and citrucel 1 heaping tsp twice daily with a large glass of water.  Pain should improve w/in 2 weeks and if not then consider further GI work up.     Call me after the holidays if not all better  Add needs w/u for pleural based mass if now if no h/o rib injury or f/u in 6 week with cxr here if this is a prior injury so we can get a baseline cxr on file

## 2018-12-02 NOTE — Progress Notes (Signed)
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 Some better with proair with  Reported abn spirometry 06/2017 (non-physiologic)  for discomfort in L chest referred to pulmonary clinic 12/02/2018 by [facility] for  ? abn cxr" =     History of Present Illness  12/02/2018  Pulmonary/ 1st office eval/Tyler Nelson  Chief Complaint  Patient presents with  . Pulmonary Consult    Referred by [facility] for eval of lung lesion. He c/o occ dull CP for the past 2 months.   Dyspnea:  No aerobics but good activity  Cough: no Sleep: able to lie flat one pillow SABA use: once or twice a month    CP x 2017 always in same location shortest few seconds longest maybe a minute never with ex  avg has it every few days typically not multiple times in the same day  Never wakes up from deep sleep  Really no unintended wt loss, relatively stable wt   No obvious day to day or daytime variability or assoc excess/ purulent sputum or mucus plugs or hemoptysis or  chest tightness, subjective wheeze or overt sinus or hb symptoms.   Sleeping  without nocturnal  or early am exacerbation  of respiratory  c/o's or need for noct saba. Also denies any obvious fluctuation of symptoms with weather or environmental changes or other aggravating or alleviating factors except as outlined above   No unusual exposure hx or h/o childhood pna/ asthma or knowledge of premature birth.  Current Allergies, Complete Past Medical History, Past Surgical History, Family History, and Social History were reviewed in [employer] record.  ROS  The following are not active complaints unless bolded Hoarseness, sore throat, dysphagia, dental problems, itching, sneezing,  nasal congestion or discharge of  excess mucus or purulent secretions, ear ache,   fever, chills, sweats, unintended wt loss or wt gain, classically pleuritic or exertional cp,  orthopnea pnd or arm/hand swelling  or leg swelling, presyncope, palpitations, abdominal pain, anorexia, nausea, vomiting, diarrhea  or change in bowel habits or change in bladder habits, change in stools or change in urine, dysuria, hematuria,  rash, arthralgias, visual complaints, headache, numbness, weakness or ataxia or problems with walking or coordination,  change in mood or  memory.               Past Medical History:  Diagnosis Date  . GERD (gastroesophageal reflux disease)   . Inguinal hernia   . Reactive airway disease    prior occasional albuterol use, but no definite asthma history    Outpatient Medications Prior to Visit  Medication Sig Dispense Refill  . Albuterol Sulfate (PROAIR HFA IN) Inhale 2 puffs into the lungs every 4 (four) hours as needed.    Marland Kitchen aspirin 81 MG tablet Take 81 mg by mouth daily.    Marland Kitchen aspirin EC 81 MG tablet Take 81 mg by mouth every 4 (four) hours as needed.    Marland Kitchen     0  . Glucosamine-Chondroit-Vit C-Mn (GLUCOSAMINE 1500 COMPLEX PO) Take by mouth.        Objective:     BP 140/78 (BP Location: Left Arm, Cuff Size: Normal)   Pulse 95   Ht  5\' 7"  (1.702 m)   Wt 161 lb 12.8 oz (73.4 kg)   SpO2 96%   BMI 25.34 kg/m   SpO2: 96 % RA   Somber amb wm nad  HEENT: nl dentition, turbinates bilaterally, and oropharynx. Nl external ear canals without cough reflex   NECK :  without JVD/Nodes/TM/ nl carotid upstrokes bilaterally   LUNGS: no acc muscle use,  Nl contour chest which is clear to A and P bilaterally without cough on insp or exp maneuvers   CV:  RRR  no s3 or murmur or increase in P2, and no edema   ABD:  soft and nontender with nl inspiratory excursion in the supine position. No bruits or organomegaly appreciated, bowel sounds nl  MS:  Nl gait/ ext warm without deformities, calf tenderness,  cyanosis or clubbing No obvious joint restrictions   SKIN: warm and dry without lesions    NEURO:  alert, approp, nl sensorium with  no motor or cerebellar deficits apparent.    cxr report 10/16/18 = 3.8 x 1.7 cm pleural based lesion/ R lower rib fx's appear chronic      Assessment   Chest discomfort Classic subdiaphragmatic pain pattern suggests ibs:  Stereotypical,   with a very limited distribution of pain locations, daytime, not usually exacerbated by exercise  or coughing, worse in sitting position, frequently associated with generalized abd bloating, not as likely to be present supine due to the dome effect of the diaphragm which  is  canceled in that position. Frequently these patients have had multiple negative GI workups and CT scans.  Treatment consists of avoiding foods that cause gas (especially boiled eggs, mexcican food but especially  beans and undercooked vegetables like  spinach and some salads)  and citrucel 1 heaping tsp twice daily with a large glass of water.  Pain should improve w/in 2 weeks and if not then consider further GI work up.        Pleural mass on R  Incidentally noted on cxr 10/16/18 assoc with rib fx's in pt with L sided atypical cp   If pt has h/o cw trauma on R most likely we'll fine this from pleural hematoma but will need f/u to be sure/ advised    Discussed in detail all the  indications, usual  risks and alternatives  relative to the benefits with patient who agrees to proceed with conservative f/u as outlined          SOB (shortness of breath) Pattern is more c/w anxiety/ panic disorder than asthma based on prior pfts (which are not physiologic) and pattern of rapid onset/ resolution daytime never nocturnal > no need to w/u further at this point/ reassurance and regular aerobic ex rec.  >>>  Could to MCT if symptoms persist (Methacholine Challenge)     Total time devoted to counseling  > 50 % of initial 60 min office visit:  review  case with pt/ discussion of options/alternatives/ personally creating written customized instructions  in presence of pt  then going over those specific  Instructions directly with the pt including how to use all of the meds but in particular covering each new medication in detail and the difference between the maintenance= "automatic" meds and the prns using an action plan format for the latter (If this problem/symptom => do that organization reading Left to right).  Please see AVS from this visit for a full list of these instructions which I personally wrote for this pt and  are  unique to this visit.      Sandrea Hughs, MD 12/02/2018

## 2018-12-03 ENCOUNTER — Telehealth: Payer: Self-pay | Admitting: *Deleted

## 2018-12-03 ENCOUNTER — Encounter: Payer: Self-pay | Admitting: Internal Medicine

## 2018-12-03 DIAGNOSIS — J948 Other specified pleural conditions: Secondary | ICD-10-CM | POA: Insufficient documentation

## 2018-12-03 NOTE — Telephone Encounter (Signed)
-----   Message from Nyoka CowdenMichael B Wert, MD sent at 12/03/2018  6:40 AM EST ----- Let him know I reviewed his cxr and the report is c/w prior injury to the R chest  - if he does not recall any prior injury,  will need CT chest with contrast but will need ov first with labs.  If does have h/o injury, just needs f/u in 6 week with prior cxr in hand.

## 2018-12-03 NOTE — Telephone Encounter (Signed)
Spoke with the pt and verbalized understanding  He states he did approx 1 yr ago and bruised some ribs on the right  OV scheduled for 01/17/18

## 2018-12-03 NOTE — Assessment & Plan Note (Signed)
Classic subdiaphragmatic pain pattern suggests ibs:  Stereotypical,   with a very limited distribution of pain locations, daytime, not usually exacerbated by exercise  or coughing, worse in sitting position, frequently associated with generalized abd bloating, not as likely to be present supine due to the dome effect of the diaphragm which  is  canceled in that position. Frequently these patients have had multiple negative GI workups and CT scans. ? ?Treatment consists of avoiding foods that cause gas (especially boiled eggs, mexcican food but especially  beans and undercooked vegetables like  spinach and some salads)  and citrucel 1 heaping tsp twice daily with a large glass of water.  Pain should improve w/in 2 weeks and if not then consider further GI work up.    ?

## 2018-12-03 NOTE — Assessment & Plan Note (Addendum)
Pattern is more c/w anxiety/ panic disorder than asthma based on prior pfts (which are not physiologic) and pattern of rapid onset/ resolution daytime never nocturnal > no need to w/u further at this point/ reassurance and regular aerobic ex rec   .>>>  Could to MCT if symptoms persist (Methacholine Challenge)   Total time devoted to counseling  > 50 % of initial 60 min office visit:  review case with pt/ discussion of options/alternatives/ personally creating written customized instructions  in presence of pt  then going over those specific  Instructions directly with the pt including how to use all of the meds but in particular covering each new medication in detail and the difference between the maintenance= "automatic" meds and the prns using an action plan format for the latter (If this problem/symptom => do that organization reading Left to right).  Please see AVS from this visit for a full list of these instructions which I personally wrote for this pt and  are unique to this visit.

## 2018-12-03 NOTE — Assessment & Plan Note (Signed)
Incidentally noted on cxr 10/16/18 assoc with rib fx's in pt with L sided atypical cp   If pt has h/o cw trauma on R most likely we'll fine this from pleural hematoma but will need f/u to be sure/ advised    Discussed in detail all the  indications, usual  risks and alternatives  relative to the benefits with patient who agrees to proceed with conservative f/u as outlined

## 2018-12-04 ENCOUNTER — Ambulatory Visit: Payer: BLUE CROSS/BLUE SHIELD | Admitting: Medical

## 2018-12-04 ENCOUNTER — Encounter: Payer: Self-pay | Admitting: Medical

## 2018-12-04 VITALS — BP 120/84 | HR 65 | Temp 97.8°F | Ht 66.0 in | Wt 162.0 lb

## 2018-12-04 DIAGNOSIS — R310 Gross hematuria: Secondary | ICD-10-CM | POA: Diagnosis not present

## 2018-12-04 DIAGNOSIS — M545 Low back pain, unspecified: Secondary | ICD-10-CM | POA: Insufficient documentation

## 2018-12-04 DIAGNOSIS — K7689 Other specified diseases of liver: Secondary | ICD-10-CM

## 2018-12-04 DIAGNOSIS — N201 Calculus of ureter: Secondary | ICD-10-CM | POA: Diagnosis not present

## 2018-12-04 DIAGNOSIS — G8929 Other chronic pain: Secondary | ICD-10-CM

## 2018-12-04 NOTE — Progress Notes (Signed)
Subjective: Chief Complaint  Patient presents with  . Follow-up    discuss ct results and last labs concerning blood in urine   . Back Pain    lower back    Here for concerns.    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  Was feeling stressed by end of the year.   Has his kids every other weekend.    Has some knee issues last year, saw PT for a while.  Still has some occasional knee pains but not so bad.  Last year was having to move heavy drums across floors which affected his knee and back.   Has had some back pains intermittent.  May of this year saw some visible blood in urine.   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  Had several +hematuria samples and was referred to [facility].  He has also had hematospermia.   They did cystoscopy and CT abdomen pelvis.  He reportedly has some kidney stones.   Cystoscopy didn't reportedly show anything else.   Was advised to have cystoscopy yearly.   He is concerned as he still gets intermittent hematuria.   Still getting intermittent back pain.  Worse if driving for hours, sometimes worse with lifting objects.  Does some exercise.   Sometimes gets leg pains, but not several or regular.   No fever no bowel issues.  Past Medical History:  Diagnosis Date  . GERD (gastroesophageal reflux disease)   . Inguinal hernia   . Reactive airway disease    prior occasional albuterol use, but no definite asthma history   Past Surgical History:  Procedure Laterality Date  . SKIN BIOPSY    . TONSILLECTOMY     ROS as in subjective   Objective: BP 120/84   Pulse 65   Temp 97.8 F (36.6 C) (Oral)   Ht 5\' 6"  (1.676 m)   Wt 162 lb (73.5 kg)   SpO2 96%   BMI 26.15 kg/m   Gen: wd, wn, nad Skin unremarkable Back non tender may be slight scoliosis noted no other deformity Range of motion relatively full  approximate 90% of normal with mild pain Legs non tender, normal range of motion Legs neurovascularly intact    Assessment: Encounter Diagnoses  Name Primary?  . Chronic bilateral low back pain without sciatica Yes  . Gross hematuria   . Ureteral stone   . Liver cyst      Plan: I reviewed his CT abdomen pelvis from 09/04/2018. The impression was as follows: 1-there is a mildly obstructing 7 mm stone within the proximal right ureter resulting in mild right hydronephrosis 2- additional non obstructing 2 mm stone superior pole left kidney 3-no suspicious enhancing renal masses identified.  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  I gave him some home exercises, stretching, and strength training exercises to do regularly at least twice a week to help reduce back pain.  He can use over-the-counter NSAID as needed.  Advise if not improving within a month we can refer to physical therapy  Renal stone and ureteral stone and hematuria-advised he needs  to go back and see Dr. Mena GoesEskridge for follow-up as it does not sound that he ever passed the stone which was 7 mm causing some hydronephrosis   [name] was seen today for follow-up and back pain.  Diagnoses and all orders for this visit:  Chronic bilateral low back pain without sciatica  Gross hematuria  Ureteral stone  Liver cyst

## 2018-12-10 ENCOUNTER — Encounter: Payer: Self-pay | Admitting: Medical

## 2019-01-17 ENCOUNTER — Ambulatory Visit (INDEPENDENT_AMBULATORY_CARE_PROVIDER_SITE_OTHER): Payer: BLUE CROSS/BLUE SHIELD | Admitting: Internal Medicine

## 2019-01-17 ENCOUNTER — Encounter: Payer: Self-pay | Admitting: Internal Medicine

## 2019-01-17 ENCOUNTER — Ambulatory Visit (INDEPENDENT_AMBULATORY_CARE_PROVIDER_SITE_OTHER)
Admission: RE | Admit: 2019-01-17 | Discharge: 2019-01-17 | Disposition: A | Discharge status: Home / Self Care | Payer: BLUE CROSS/BLUE SHIELD | Source: Ambulatory Visit | Attending: Internal Medicine | Admitting: Internal Medicine

## 2019-01-17 VITALS — BP 124/80 | HR 60 | Ht 66.0 in | Wt 168.0 lb

## 2019-01-17 DIAGNOSIS — J948 Other specified pleural conditions: Secondary | ICD-10-CM | POA: Diagnosis not present

## 2019-01-17 DIAGNOSIS — R0602 Shortness of breath: Secondary | ICD-10-CM

## 2019-01-17 DIAGNOSIS — R0789 Other chest pain: Secondary | ICD-10-CM | POA: Diagnosis not present

## 2019-01-17 DIAGNOSIS — R0600 Dyspnea, unspecified: Secondary | ICD-10-CM

## 2019-01-17 NOTE — Patient Instructions (Signed)
Classic subdiaphragmatic pain pattern suggests ibs:  Stereotypical, migratory with a very limited distribution of pain locations, daytime, not usually exacerbated by exercise  or coughing, worse in sitting position, frequently associated with generalized abd bloating, not as likely to be present supine due to the dome effect of the diaphragm which  is  canceled in that position. Frequently these patients have had multiple negative GI workups and CT scans.  Treatment consists of avoiding foods that cause gas (especially boiled eggs, mexcican food but especially  beans and undercooked vegetables like  spinach and some salads)  and citrucel 1 heaping tsp twice daily with a large glass of water.  Pain should improve w/in 2 weeks and if not then consider further GI work up.      To get the most out of exercise, you need to be continuously aware that you are short of breath, but never out of breath, for 30 minutes daily. As you improve, it will actually be easier for you to do the same amount of exercise  in  30 minutes so always push to the level where you are short of breath.    Please remember to go to the  x-ray department  for your tests - we will call you with the results when they are available

## 2019-01-17 NOTE — Progress Notes (Signed)
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 Some better with proair with  Reported abn spirometry 06/2017 (non-physiologic)  for discomfort in L chest referred to pulmonary clinic 12/02/2018 by [facility] for  ? abn cxr"    Feb 2019 fell in snow as trying to scrape snow off back window, hit chest does not recall which side     History of Present Illness  12/02/2018  Pulmonary/ 1st office eval/Ammie Warrick  Chief Complaint  Patient presents with  . Pulmonary Consult    Referred by [facility] for eval of lung lesion. He c/o occ dull CP for the past 2 months.   Dyspnea:  No aerobics but good activity  Cough: no Sleep: able to lie flat one pillow SABA use: once or twice a month  CP x 2017 always in same location shortest few seconds longest maybe a minute never with ex  avg has it every few days typically not multiple times in the same day  Never wakes up from deep sleep   no unintended wt loss  rec Classic subdiaphragmatic pain pattern suggests ibs:   rx diet  and citrucel 1 heaping tsp twice daily with a large glass of water.  Pain should improve w/in 2 weeks and if not then consider further GI work up.    01/17/2019  f/u ov/Tyhesha Dutson re: cp 2017  Chief Complaint  Patient presents with  . Follow-up    Chest discomort has been better since the last visit.    Dyspnea:  No aerobics but up steps all day fine  Cough: no Sleeping: on side or back/ one pillow SABA use: not using much at all  Cp always on L ant parasternal but improved on low gas diet   No obvious day to day or daytime variability or assoc excess/ purulent sputum or mucus plugs or hemoptysis or chest tightness, subjective wheeze or overt sinus or hb symptoms.   Sleeping  without  nocturnal  or early am exacerbation  of respiratory  c/o's or need for noct saba. Also denies any obvious fluctuation of symptoms with weather or environmental changes or other aggravating or alleviating factors except as outlined above   No unusual exposure hx or h/o childhood pna/ asthma or knowledge of premature birth.  Current Allergies, Complete Past Medical History, Past Surgical History, Family History, and Social History were reviewed in [employer] record.  ROS  The following are not active complaints unless bolded Hoarseness, sore throat, dysphagia, dental problems, itching, sneezing,  nasal congestion or discharge of excess mucus or purulent secretions, ear ache,   fever, chills, sweats, unintended wt loss or wt gain, classically pleuritic or exertional cp,  orthopnea pnd or arm/hand swelling  or leg swelling, presyncope, palpitations, abdominal pain = indigestion, anorexia, nausea, vomiting, diarrhea  or change in bowel habits or change in bladder habits, change in stools or change in urine, dysuria, hematuria,  rash, arthralgias, visual complaints, headache, numbness, weakness or ataxia or problems with walking or coordination,  change in mood or  memory.        Current Meds  Medication Sig  . Albuterol Sulfate (PROAIR HFA IN) Inhale 2 puffs into the  lungs every 4 (four) hours as needed.  Marland Kitchen aspirin 81 MG tablet Take 81 mg by mouth daily.                            Objective:     amb wm nad   Wt Readings from Last 3 Encounters:  01/17/19 168 lb (76.2 kg)  12/04/18 162 lb (73.5 kg)  12/02/18 161 lb 12.8 oz (73.4 kg)     Vital signs reviewed - Note on arrival 02 sats  100% on RA      HEENT: nl dentition, turbinates bilaterally, and oropharynx. Nl external ear canals without cough reflex   NECK :  without JVD/Nodes/TM/ nl carotid upstrokes bilaterally   LUNGS: no acc muscle use,  Nl contour chest which is clear to A and P bilaterally  without cough on insp or exp maneuvers   CV:  RRR  no s3 or murmur or increase in P2, and no edema   ABD:  soft and nontender with nl inspiratory excursion in the supine position. No bruits or organomegaly appreciated, bowel sounds nl  MS:  Nl gait/ ext warm without deformities, calf tenderness, cyanosis or clubbing No obvious joint restrictions   SKIN: warm and dry without lesions    NEURO:  alert, approp, nl sensorium with  no motor or cerebellar deficits apparent.     CXR PA and Lateral:   01/17/2019 :    I personally reviewed images and agree with radiology impression as follows:  1. Focal peripheral density in the right upper lateral chest, possibly callus formation related to a prior rib fracture. Recommend comparison with any prior recent studies to establish stability. If none are available a chest CT may be helpful         Assessment

## 2019-01-19 ENCOUNTER — Encounter: Payer: Self-pay | Admitting: Internal Medicine

## 2019-01-19 NOTE — Assessment & Plan Note (Signed)
rx as IBS 12/02/2018 >  Improved 01/17/2019   rec add citucel if pain worsens / ct chest needed to w/u the pleural density but not the sob which is not reproducible with exertion.    I had an extended discussion with the patient reviewing all relevant studies completed to date and  lasting 15 to 20 minutes of a 25 minute visit    Each maintenance medication was reviewed in detail including most importantly the difference between maintenance and prns and under what circumstances the prns are to be triggered using an action plan format that is not reflected in the computer generated alphabetically organized AVS.     Please see AVS for specific instructions unique to this visit that I personally wrote and verbalized to the the pt in detail and then reviewed with pt  by my nurse highlighting any  changes in therapy recommended at today's visit to their plan of care.
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
## 2019-01-19 NOTE — Assessment & Plan Note (Addendum)
Abn spirometry 06/2017 (non-physiologic)  - Spirometry 01/17/2019  FEV1 3.0 (86%)  Ratio 0.78 without curvature off all rx    Strongly suspect anxiety / panic disorder over asthma here as his episodes of gasping for breath are fleeting and resolve s specific rx and not present noct or with ex > no further w/u needed but happy to see in office for any exacerbations

## 2019-01-20 ENCOUNTER — Telehealth: Payer: Self-pay | Admitting: Internal Medicine

## 2019-01-20 NOTE — Telephone Encounter (Signed)
ATC pt, no answer. Left message for pt to call back.    Notes recorded by Nyoka Cowden, MD on 01/20/2019 at 5:39 AM EST Call pt: Reviewed cxr and the area of concern is way higher than shown on prior ct abdomen so needs CT with contrast Dx chest pain

## 2019-01-20 NOTE — Telephone Encounter (Signed)
Pt requesting cxr results.

## 2019-01-21 NOTE — Telephone Encounter (Signed)
Pt is requesting CB. Sates a detailed message can be left oh his vm. CB# [phone]

## 2019-01-21 NOTE — Telephone Encounter (Signed)
Patient states he came in and signed DPR authorizing us to leave detailed msg, CB is [phone]

## 2019-01-21 NOTE — Telephone Encounter (Signed)
Per notes recorded by MW patient is needing a CT. Patient will still need to contact us back. Called patient, unable to reach. Left message to give [place] a call back due to further recommendations.

## 2019-01-21 NOTE — Telephone Encounter (Signed)
Called patient, unable to reach LMTCB 

## 2019-01-22 NOTE — Telephone Encounter (Signed)
Attempted to call Patient.  Left message to call back. 

## 2019-01-22 NOTE — Telephone Encounter (Signed)
Pt is calling back [phone]

## 2019-01-22 NOTE — Telephone Encounter (Signed)
Patient returned call.  He stated that due to financial reasons he needs more information on why he needs CT chest.  He stated that he is still paying on previous CT.  He has requested Dr Sherene Sires call him with more explanation.  Per Dr Sherene Sires, 01/20/19- Notes recorded by Nyoka Cowden, MD on 01/20/2019 at 5:39 AM EST Call pt: Reviewed cxr and the area of concern is way higher than shown on prior ct abdomen so needs CT with contrast Dx chest pain    Message routed to Dr Sherene Sires

## 2019-01-22 NOTE — Progress Notes (Signed)
Left detailed msg asking him to call back and let us know if he is agreeable to ct chest

## 2019-01-23 NOTE — Telephone Encounter (Signed)
lmom reiterating same message and rec to complete the ct in the next 6 months

## 2019-01-24 NOTE — Telephone Encounter (Signed)
Discussed with pt  1) declines additional CT's at this time though I informed him that although  there is a risk of not having the scan, I think it's low and ok to wait 6 months but that's up to him and he will need to call to schedule this ( I put reminder in file in case he forgets for 6 m)  2) offered ov to go over this again, declined   3) offered 2nd opinion, declined   Will inform PCP  - pt seems to be having a tough time grasping the above recs and repeating same questions over and over "well what could it be"  - fibrous tumor is in the ddx but no further w/u feasible  until ct with contrast  is done - note does not have h/o pain over the location of the abn (R axillary area)

## 2019-01-24 NOTE — Telephone Encounter (Signed)
Get in for yearly physical

## 2019-01-24 NOTE — Telephone Encounter (Signed)
Called and spoke to pt.  Pt states that he did receive Dr. Thurston HoleWert's message regarding CT.  Pt has additional questions regarding what was found on CXR.  MW please advise. Thanks.

## 2019-01-27 NOTE — Telephone Encounter (Signed)
Left message on voicemail for patient to call and schedule fasting CPE.

## 2019-02-05 DIAGNOSIS — Z0279 Encounter for issue of other medical certificate: Secondary | ICD-10-CM

## 2019-03-16 IMAGING — DX DG KNEE COMPLETE 4+V*L*
4 series · 4 of 4 positions shown · non-contrast
Comparison: None.

CLINICAL DATA: Medial knee pain, no known injury, initial encounter

EXAM:
LEFT KNEE - COMPLETE 4+ VIEW

[dg knee complete 4 views left (1 of 4)]
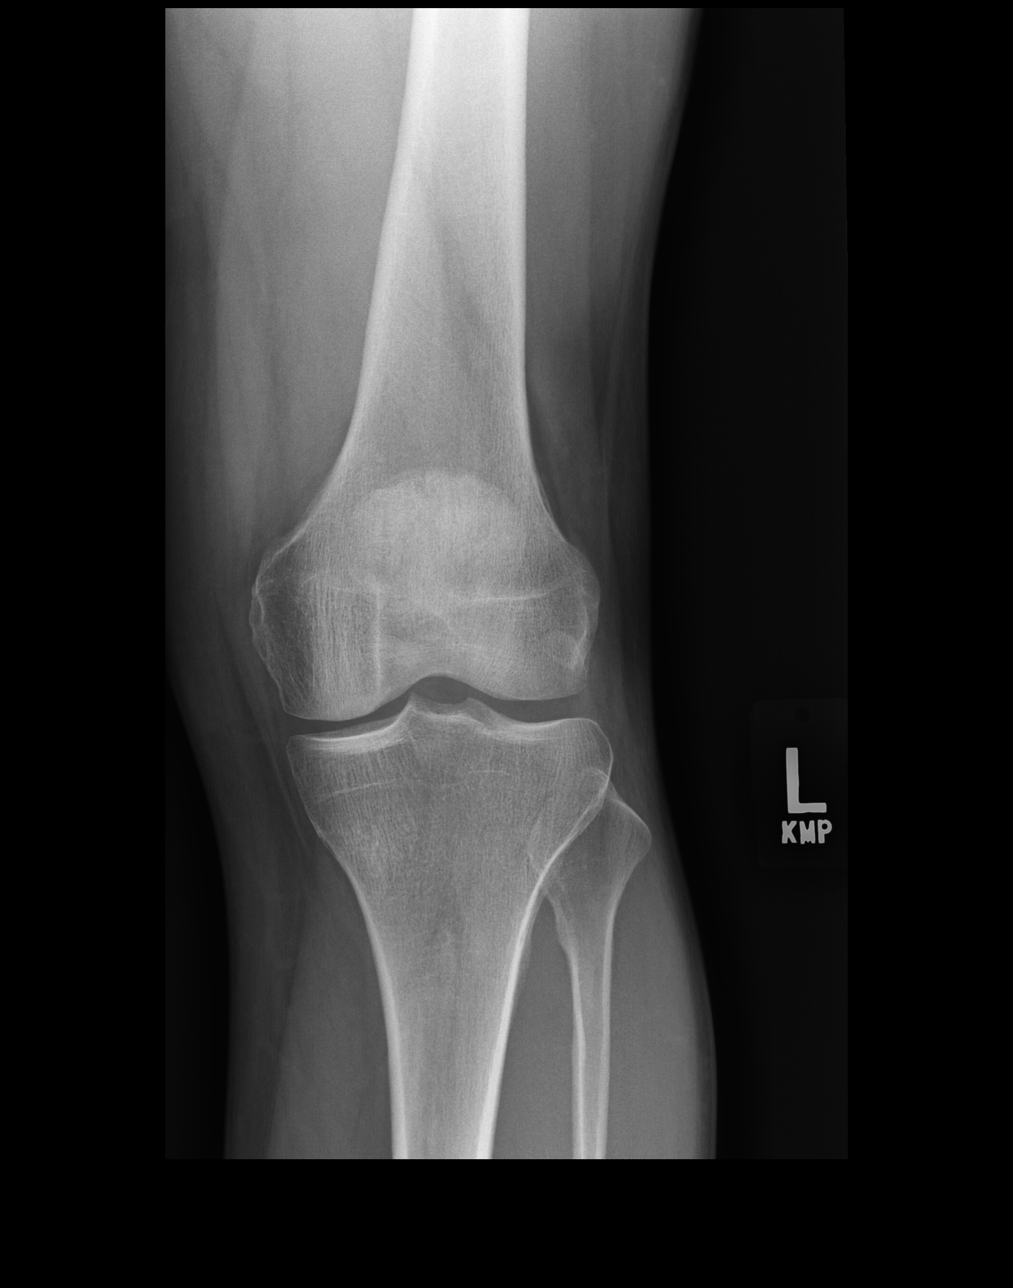

[dg knee complete 4 views left (2 of 4)]
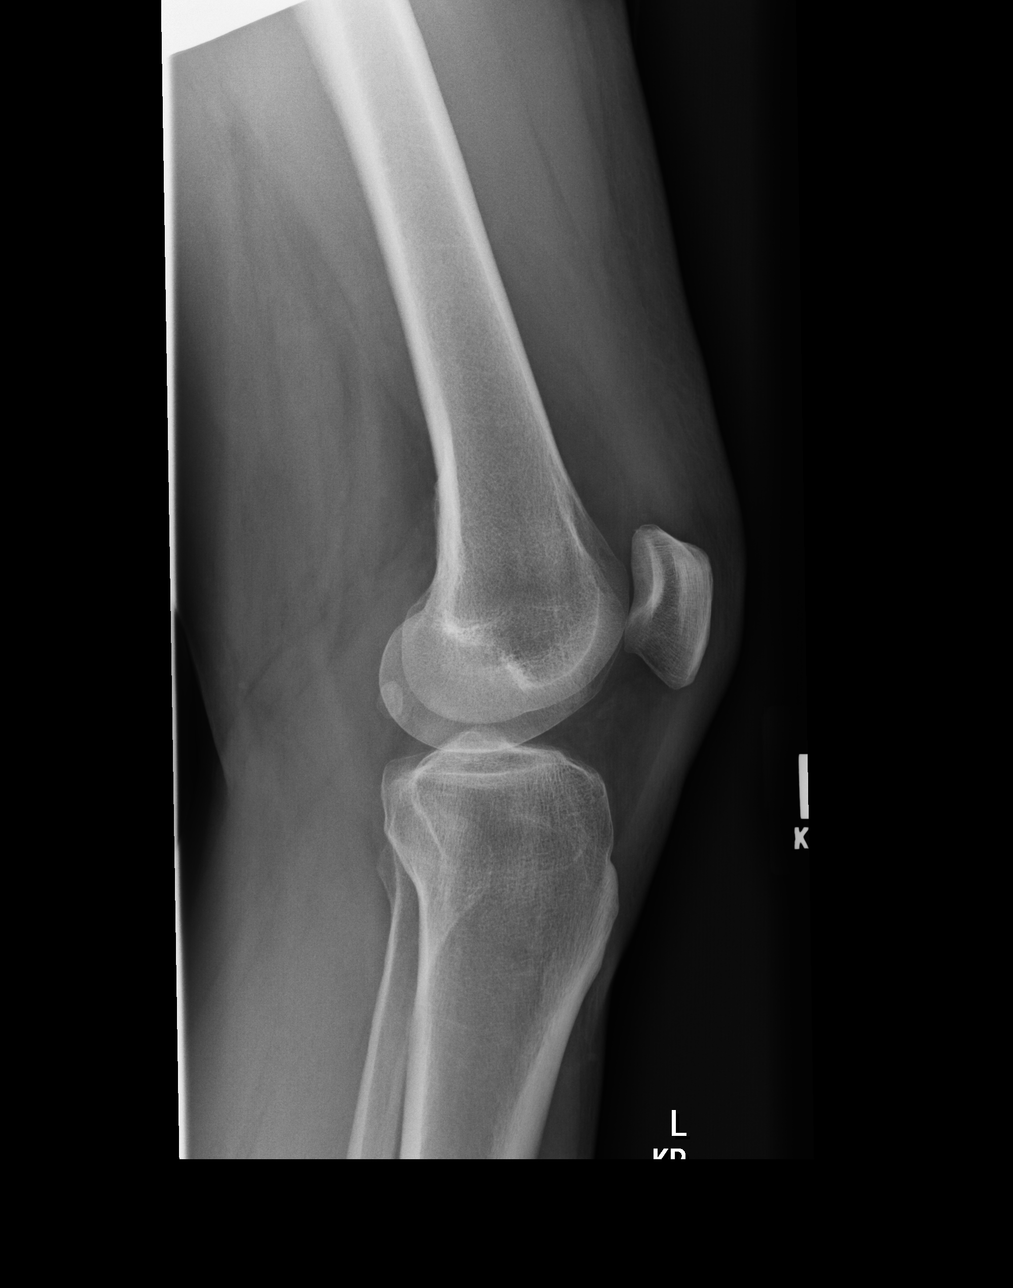

[dg knee complete 4 views left (3 of 4)]
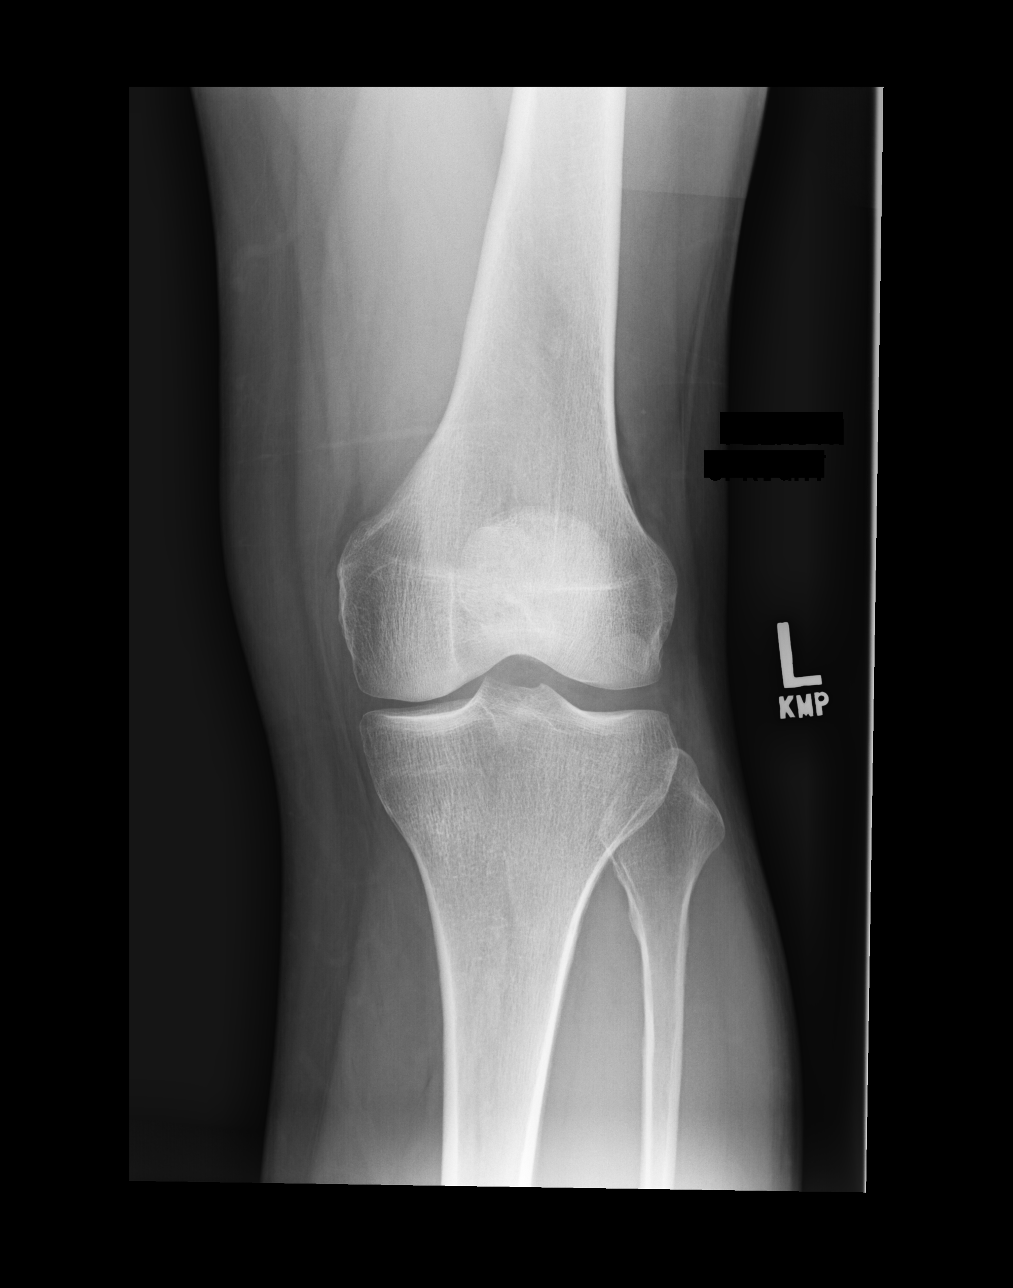

[dg knee complete 4 views left (4 of 4)]
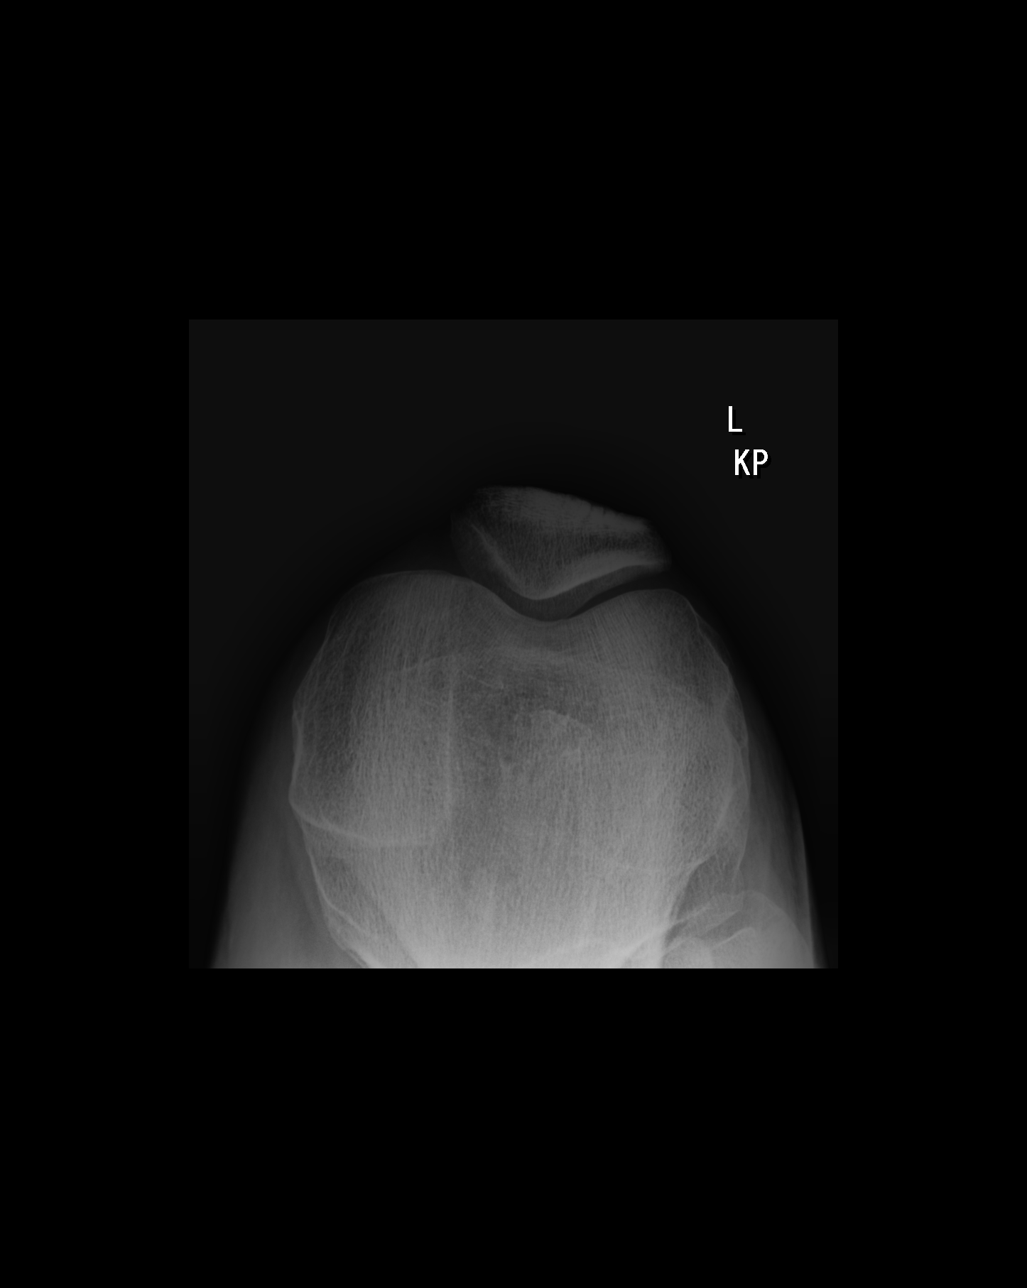

[4 of 4 positions shown; findings below may reference images not displayed]

FINDINGS: No acute fracture or dislocation is noted. No soft tissue
abnormality seen. No joint effusion noted. Mild medial joint space
narrowing is seen.
IMPRESSION: Mild degenerative change without acute abnormality.

## 2019-07-25 ENCOUNTER — Telehealth: Payer: Self-pay | Admitting: *Deleted

## 2019-07-25 DIAGNOSIS — J948 Other specified pleural conditions: Secondary | ICD-10-CM

## 2019-07-25 NOTE — Telephone Encounter (Signed)
-----   Message from Tanda Rockers, MD sent at 01/24/2019  9:20 AM EST ----- Call to remind him that I rec a ct w/in 6 months

## 2019-07-25 NOTE — Telephone Encounter (Signed)
Spoke with the pt and he is aware  Is this with or without contrast? Please advise and I will then order, thanks

## 2019-07-25 NOTE — Telephone Encounter (Signed)
Because it is pleural based needs contrast

## 2019-07-25 NOTE — Telephone Encounter (Signed)
Done/ordered.

## 2019-08-18 ENCOUNTER — Inpatient Hospital Stay: Admission: RE | Admit: 2019-08-18 | Payer: BLUE CROSS/BLUE SHIELD | Source: Ambulatory Visit

## 2020-03-08 ENCOUNTER — Encounter: Payer: Self-pay | Admitting: Medical

## 2020-03-08 ENCOUNTER — Ambulatory Visit (INDEPENDENT_AMBULATORY_CARE_PROVIDER_SITE_OTHER): Payer: BC Managed Care – PPO | Admitting: Medical

## 2020-03-08 ENCOUNTER — Other Ambulatory Visit: Payer: Self-pay

## 2020-03-08 VITALS — BP 120/86 | HR 88 | Temp 98.4°F | Ht 66.0 in | Wt 163.2 lb

## 2020-03-08 DIAGNOSIS — K219 Gastro-esophageal reflux disease without esophagitis: Secondary | ICD-10-CM | POA: Insufficient documentation

## 2020-03-08 DIAGNOSIS — R0789 Other chest pain: Secondary | ICD-10-CM | POA: Diagnosis not present

## 2020-03-08 DIAGNOSIS — G8929 Other chronic pain: Secondary | ICD-10-CM

## 2020-03-08 DIAGNOSIS — Z87442 Personal history of urinary calculi: Secondary | ICD-10-CM

## 2020-03-08 DIAGNOSIS — Z0000 Encounter for general adult medical examination without abnormal findings: Secondary | ICD-10-CM

## 2020-03-08 DIAGNOSIS — N201 Calculus of ureter: Secondary | ICD-10-CM

## 2020-03-08 DIAGNOSIS — M545 Low back pain: Secondary | ICD-10-CM

## 2020-03-08 DIAGNOSIS — K30 Functional dyspepsia: Secondary | ICD-10-CM | POA: Diagnosis not present

## 2020-03-08 DIAGNOSIS — R9431 Abnormal electrocardiogram [ECG] [EKG]: Secondary | ICD-10-CM

## 2020-03-08 DIAGNOSIS — K7689 Other specified diseases of liver: Secondary | ICD-10-CM

## 2020-03-08 DIAGNOSIS — R310 Gross hematuria: Secondary | ICD-10-CM

## 2020-03-08 LAB — POCT URINALYSIS DIP (PROADVANTAGE DEVICE)
Bilirubin, UA: NEGATIVE
Glucose, UA: NEGATIVE mg/dL
Leukocytes, UA: NEGATIVE
Nitrite, UA: NEGATIVE
Specific Gravity, Urine: 1.02
Urobilinogen, Ur: NEGATIVE
pH, UA: 6 (ref 5.0–8.0)

## 2020-03-08 LAB — POCT UA - MICROSCOPIC ONLY: WBC, Ur, HPF, POC: POSITIVE

## 2020-03-08 NOTE — Progress Notes (Signed)
Subjective:   HPI  Tyler Nelson is a 49 y.o. male who presents for Chief Complaint  Patient presents with  . Annual Exam    with fasting labs     Patient Care Team: Mashal Slavick, Kermit Balo, PA-C as PCP - General (Family Medicine) Sees dentist Sees eye doctor  Concerns: Lately having some indigestion.   Went to [facility] in January and early march, had EKG both times.   He is concerned about his heart.  Wants to discuss.  Wants to re-look at cholesterol.   Last 2 weeks was having indigestion symptoms, not so much this week.  Worse with eating.   Can also feels some pain in chest with activity/physical motion.    Has had some nausea, but no SOB, no wheezing ,no dizziness.   He doesn't have the recent EKG with him.    Has hx/o kidney stones.   Has had some blood in urine of late, wonders about other stone passing.  Last blood in urine yesterday.  Has lower back DDD.   Curious about other therapy.  Sometimes crossing legs gets some leg pains.    Gets some pains in right leg particularly with crossing legs.   Sometimes pains in low back.   Not worse with exercise.     Reviewed their medical, surgical, family, social, medication, and allergy history and updated chart as appropriate.  Past Medical History:  Diagnosis Date  . GERD (gastroesophageal reflux disease)   . Inguinal hernia   . Reactive airway disease    prior occasional albuterol use, but no definite asthma history    Past Surgical History:  Procedure Laterality Date  . SKIN BIOPSY    . TONSILLECTOMY      Social History   Socioeconomic History  . Marital status: Divorced    Spouse name: Not on file  . Number of children: Not on file  . Years of education: Not on file  . Highest education level: Not on file  Occupational History  . Not on file  Tobacco Use  . Smoking status: Never Smoker  . Smokeless tobacco: Never Used  Substance and Sexual Activity  . Alcohol use: No    Alcohol/week: 0.0 standard  drinks    Comment: occasional  . Drug use: No  . Sexual activity: Not on file  Other Topics Concern  . Not on file  Social History Narrative   Separated, has 3 adopted kids from [place].  Exercise - walking, always on the move.  Works at [employer], Arts development officer.   Prior was in Geographical information systems officer.    02/2020   Social Determinants of Health   Financial Resource Strain:   . Difficulty of Paying Living Expenses:   Food Insecurity:   . Worried About Programme researcher, broadcasting/film/video in the Last Year:   . Barista in the Last Year:   Transportation Needs:   . Freight forwarder (Medical):   Marland Kitchen Lack of Transportation (Non-Medical):   Physical Activity:   . Days of Exercise per Week:   . Minutes of Exercise per Session:   Stress:   . Feeling of Stress :   Social Connections:   . Frequency of Communication with Friends and Family:   . Frequency of Social Gatherings with Friends and Family:   . Attends Religious Services:   . Active Member of Clubs or Organizations:   . Attends Banker Meetings:   Marland Kitchen Marital Status:  Intimate Partner Violence:   . Fear of Current or Ex-Partner:   . Emotionally Abused:   Marland Kitchen Physically Abused:   . Sexually Abused:     Family History  Problem Relation Age of Onset  . Hyperlipidemia Mother   . Hypertension Mother   . Stroke Mother   . Hypertension Father   . Diabetes Maternal Grandmother   . Cancer Maternal Grandfather        leukemia  . Heart disease Maternal Grandfather 70  . Cancer Paternal Grandfather 51       prostate  . Thyroid disease Sister      Current Outpatient Medications:  .  Albuterol Sulfate (PROAIR HFA IN), Inhale 2 puffs into the lungs every 4 (four) hours as needed., Disp: , Rfl:  .  aspirin 81 MG tablet, Take 81 mg by mouth daily., Disp: , Rfl:   Allergies  Allergen Reactions  . Codeine       Review of Systems Constitutional: -fever, -chills, -sweats, -unexpected weight change, -decreased appetite,  -fatigue Allergy: -sneezing, -itching, -congestion Dermatology: -changing moles, --rash, -lumps ENT: -runny nose, -ear pain, -sore throat, -hoarseness, -sinus pain, -teeth pain, - ringing in ears, -hearing loss, -nosebleeds Cardiology: +chest pain, -palpitations, -swelling, -difficulty breathing when lying flat, -waking up short of breath Respiratory: -cough, -shortness of breath, -difficulty breathing with exercise or exertion, -wheezing, -coughing up blood Gastroenterology: -abdominal pain, +nausea, -vomiting, -diarrhea, -constipation, -blood in stool, -changes in bowel movement, -difficulty swallowing or eating Hematology: -bleeding, -bruising  Musculoskeletal: -joint aches, -muscle aches, -joint swelling, +back pain, -neck pain, -cramping, -changes in gait Ophthalmology: denies vision changes, eye redness, itching, discharge Urology: -burning with urination, -difficulty urinating, -blood in urine, -urinary frequency, -urgency, -incontinence Neurology: -headache, -weakness, -tingling, -numbness, -memory loss, -falls, -dizziness Psychology: -depressed mood, -agitation, -sleep problems Male GU: no testicular mass, pain, no lymph nodes swollen, no swelling, no rash.     Objective:  BP 120/86   Pulse 88   Temp 98.4 F (36.9 C)   Ht 5\' 6"  (1.676 m)   Wt 163 lb 3.2 oz (74 kg)   SpO2 96%   BMI 26.34 kg/m   General appearance: alert, no distress, WD/WN, Caucasian male Skin: scattered macules, no worrisome lesions Neck: supple, no lymphadenopathy, no thyromegaly, no masses, normal ROM, no bruits Chest: non tender, normal shape and expansion Heart: RRR, normal S1, S2, no murmurs Lungs: CTA bilaterally, no wheezes, rhonchi, or rales Abdomen: +bs, soft, non tender, non distended, no masses, no hepatomegaly, no splenomegaly, no bruits Back: non tender, normal ROM, no scoliosis, left mid back just lateral to the spine with raised subcutaneous mass, mobile, soft and nontender suggestive of  small sebaceous cyst, less than 1 cm diameter Musculoskeletal: Legs nontender, normal range of motion, otherwise upper extremities non tender, no obvious deformity, normal ROM throughout, lower extremities non tender, no obvious deformity, normal ROM throughout Extremities: no edema, no cyanosis, no clubbing Pulses: 2+ symmetric, upper and lower extremities, normal cap refill Neurological: alert, oriented x 3, CN2-12 intact, strength normal upper extremities and lower extremities, sensation normal throughout, DTRs 2+ throughout, no cerebellar signs, gait normal Psychiatric: normal affect, behavior normal, pleasant  GU: normal male external genitalia,circumcised, nontender, no masses, no hernia, no lymphadenopathy Rectal: deferred   Assessment and Plan :   Encounter Diagnoses  Name Primary?  . Encounter for health maintenance examination in adult Yes  . Indigestion   . Chest discomfort   . Abnormal EKG   . Gastroesophageal reflux disease, unspecified  whether esophagitis present   . Chronic bilateral low back pain without sciatica   . History of kidney stones   . Gross hematuria   . Ureteral stone   . Liver cyst     Physical exam - discussed and counseled on healthy lifestyle, diet, exercise, preventative care, vaccinations, sick and well care, proper use of emergency dept and after hours care, and addressed their concerns.    Health screening: See your eye doctor yearly for routine vision care. See your dentist yearly for routine dental care including hygiene visits twice yearly.  Cancer screening Advised monthly self testicular exam  Colonoscopy:  Advised he check with insurance about covreage now given age >26yo  Discussed PSA, prostate exam, and prostate cancer screening risks/benefits.       Vaccinations: Advised yearly influenza vaccine advised Td Advised covid vaccine  Separate significant issues discussed: Chest discomfort, indigestion - begin Omeprazole.   symptom are atypical.   Nevertheless, given his concern for baseline consult, abnormal EKG prior with incomplete RBBB, refer to cardiology for consult.  GERD  - begin omeprazole, avoid GERD triggers  Hematuria, hx/o renal stones - urinalysis abnormal today.  Urine microscopic shows 3-4 RBCs per field, positive white blood cells, will send for culture.  Consider urology follow-up versus scan.  He declines STD screen today  Liver cyst on 2019 CT abdomen.  Consider repeat imaging for surveillance  Chronic low back pain - he declines PT consult.  Consider repeat imaging, continue home exercise and stretching, consider ortho consult    Teejay was seen today for annual exam.  Diagnoses and all orders for this visit:  Encounter for health maintenance examination in adult -     CBC with Differential/Platelet -     Comprehensive metabolic panel -     PSA -     Lipid panel -     POCT Urinalysis DIP (Proadvantage Device) -     Ambulatory referral to Cardiology  Indigestion  Chest discomfort -     Ambulatory referral to Cardiology  Abnormal EKG -     Ambulatory referral to Cardiology  Gastroesophageal reflux disease, unspecified whether esophagitis present  Chronic bilateral low back pain without sciatica  History of kidney stones  Gross hematuria -     PSA -     POCT Urinalysis DIP (Proadvantage Device) -     Urine Culture  Ureteral stone -     POCT Urinalysis DIP (Proadvantage Device) -     POCT UA - Microscopic Only  Liver cyst    Follow-up pending labs, yearly for physical

## 2020-03-09 ENCOUNTER — Other Ambulatory Visit: Payer: Self-pay | Admitting: Medical

## 2020-03-09 LAB — COMPREHENSIVE METABOLIC PANEL WITH GFR
ALT: 28 IU/L (ref 0–44)
AST: 24 IU/L (ref 0–40)
Albumin/Globulin Ratio: 1.6 (ref 1.2–2.2)
Albumin: 4.2 g/dL (ref 4.0–5.0)
Alkaline Phosphatase: 59 IU/L (ref 39–117)
BUN/Creatinine Ratio: 20 (ref 9–20)
BUN: 17 mg/dL (ref 6–24)
Bilirubin Total: 0.4 mg/dL (ref 0.0–1.2)
CO2: 24 mmol/L (ref 20–29)
Calcium: 9.7 mg/dL (ref 8.7–10.2)
Chloride: 104 mmol/L (ref 96–106)
Creatinine, Ser: 0.86 mg/dL (ref 0.76–1.27)
GFR calc Af Amer: 119 mL/min/1.73 (ref >59)
GFR calc non Af Amer: 103 mL/min/1.73 (ref >59)
Globulin, Total: 2.7 g/dL (ref 1.5–4.5)
Glucose: 82 mg/dL (ref 65–99)
Potassium: 4 mmol/L (ref 3.5–5.2)
Sodium: 142 mmol/L (ref 134–144)
Total Protein: 6.9 g/dL (ref 6.0–8.5)

## 2020-03-09 LAB — CBC WITH DIFFERENTIAL/PLATELET
Basophils Absolute: 0.1 10*3/uL (ref 0.0–0.2)
Basos: 1 %
EOS (ABSOLUTE): 0.3 10*3/uL (ref 0.0–0.4)
Eos: 3 %
Hematocrit: 43.3 % (ref 37.5–51.0)
Hemoglobin: 14.9 g/dL (ref 13.0–17.7)
Immature Grans (Abs): 0 10*3/uL (ref 0.0–0.1)
Immature Granulocytes: 0 %
Lymphocytes Absolute: 2 10*3/uL (ref 0.7–3.1)
Lymphs: 25 %
MCH: 31.7 pg (ref 26.6–33.0)
MCHC: 34.4 g/dL (ref 31.5–35.7)
MCV: 92 fL (ref 79–97)
Monocytes Absolute: 0.8 10*3/uL (ref 0.1–0.9)
Monocytes: 10 %
Neutrophils Absolute: 4.9 10*3/uL (ref 1.4–7.0)
Neutrophils: 61 %
Platelets: 339 10*3/uL (ref 150–450)
RBC: 4.7 x10E6/uL (ref 4.14–5.80)
RDW: 12.4 % (ref 11.6–15.4)
WBC: 8 10*3/uL (ref 3.4–10.8)

## 2020-03-09 LAB — LIPID PANEL
Chol/HDL Ratio: 3.4 ratio (ref 0.0–5.0)
Cholesterol, Total: 175 mg/dL (ref 100–199)
HDL: 51 mg/dL (ref >39)
LDL Chol Calc (NIH): 112 mg/dL — ABNORMAL HIGH (ref 0–99)
Triglycerides: 65 mg/dL (ref 0–149)
VLDL Cholesterol Cal: 12 mg/dL (ref 5–40)

## 2020-03-09 LAB — URINE CULTURE: Organism ID, Bacteria: NO GROWTH

## 2020-03-09 LAB — PSA: Prostate Specific Ag, Serum: 1.2 ng/mL (ref 0.0–4.0)

## 2020-03-09 MED ORDER — TAMSULOSIN HCL 0.4 MG PO CAPS: 0.4000 mg | ORAL_CAPSULE | Freq: Every day | ORAL | 1 refills | Status: DC

## 2020-03-09 MED ORDER — ALBUTEROL SULFATE 108 (90 BASE) MCG/ACT IN AEPB: 2.0000 | INHALATION_SPRAY | Freq: Four times a day (QID) | RESPIRATORY_TRACT | 0 refills | Status: DC | PRN

## 2020-03-09 MED ORDER — OMEPRAZOLE 40 MG PO CPDR: 40.0000 mg | DELAYED_RELEASE_CAPSULE | Freq: Every day | ORAL | 1 refills | Status: DC

## 2020-03-15 ENCOUNTER — Encounter: Payer: Self-pay | Admitting: Medical

## 2020-03-16 ENCOUNTER — Telehealth: Payer: Self-pay

## 2020-03-16 NOTE — Telephone Encounter (Signed)
Pt. Is aware to get which ever shot is available to him first.

## 2020-03-16 NOTE — Telephone Encounter (Signed)
Patient called and wants to know if there is a particular COVID vaccine that he should be taking since he has asthma ort does it not matter which one he takes.

## 2020-03-16 NOTE — Telephone Encounter (Signed)
Get what ever shot is available

## 2020-03-18 ENCOUNTER — Ambulatory Visit: Payer: Self-pay | Attending: Family

## 2020-03-18 DIAGNOSIS — Z23 Encounter for immunization: Secondary | ICD-10-CM

## 2020-03-18 NOTE — Progress Notes (Signed)
   Covid-19 Vaccination Clinic  Name:  Tyler Nelson    MRN: [id number] DOB: 30-Oct-1971  03/18/2020  [name] was observed post Covid-19 immunization for 15 minutes without incident. He was provided with Vaccine Information Sheet and instruction to access the V-Safe system.   [name] was instructed to call 911 with any severe reactions post vaccine: Marland Kitchen Difficulty breathing  . Swelling of face and throat  . A fast heartbeat  . A bad rash all over body  . Dizziness and weakness   Immunizations Administered    Name Date Dose VIS Date Route   Moderna COVID-19 Vaccine 03/18/2020  3:22 PM 0.5 mL 11/18/2019 Intramuscular   Manufacturer: Moderna   Lot: [id number]   NDC: 69806-078-95

## 2020-03-29 ENCOUNTER — Ambulatory Visit: Payer: BC Managed Care – PPO | Admitting: Cardiology

## 2020-03-29 ENCOUNTER — Other Ambulatory Visit: Payer: Self-pay

## 2020-03-29 ENCOUNTER — Encounter: Payer: Self-pay | Admitting: Cardiology

## 2020-03-29 VITALS — BP 124/87 | HR 83 | Temp 98.3°F | Resp 15 | Ht 66.0 in | Wt 164.0 lb

## 2020-03-29 DIAGNOSIS — I4510 Unspecified right bundle-branch block: Secondary | ICD-10-CM

## 2020-03-29 DIAGNOSIS — R072 Precordial pain: Secondary | ICD-10-CM

## 2020-03-29 DIAGNOSIS — R002 Palpitations: Secondary | ICD-10-CM

## 2020-03-29 NOTE — Progress Notes (Signed)
REASON FOR CONSULT: Chest discomfort and abnormal EKG.   Chief Complaint  Patient presents with  . Chest Pain    REQUESTING PHYSICIAN:  Jac Canavan, PA-C [address] [place],  [place] [address]  HPI  Tyler Nelson is a 49 y.o. male who presents to the office with a chief complaint of " chest pain."    Patient is referred to the office at the request of his primary care provider for evaluation of chest discomfort and abnormal EKG.  Chest pain: Patient states his symptoms started about several months ago and has been persistent.  The pain is located over the left anterior chest wall, described as a dull-like sensation, intensity 4-10, intermittent, last for about a few minutes, self-limited.  At times the discomfort does radiate to the left arm into the fingertips.  No improving factors.  Patient states that symptoms are at times brought on after eating.  Overall nonexertional and symptoms may improve after resting (only at times) at times improves with drinking water.   Denies prior history of coronary artery disease, myocardial infarction, congestive heart failure, deep venous thrombosis, pulmonary embolism, stroke, transient ischemic attack.  FUNCTIONAL STATUS: He states that he walks approximately 1 to 2 miles per day and also does his own yard work.  ALLERGIES: Allergies  Allergen Reactions  . Codeine    MEDICATION LIST PRIOR TO VISIT: Current Outpatient Medications on File Prior to Visit  Medication Sig Dispense Refill  . Albuterol Sulfate 108 (90 Base) MCG/ACT AEPB Inhale 2 puffs into the lungs every 6 (six) hours as needed. 1 each 0  . aspirin 81 MG tablet Take 81 mg by mouth daily.    Marland Kitchen omeprazole (PRILOSEC) 40 MG capsule Take 1 capsule (40 mg total) by mouth daily. 30 capsule 1  . tamsulosin (FLOMAX) 0.4 MG CAPS capsule Take 1 capsule (0.4 mg total) by mouth daily after supper. 30 capsule 1   No current facility-administered medications on file prior to  visit.    PAST MEDICAL HISTORY: Past Medical History:  Diagnosis Date  . Asthma   . GERD (gastroesophageal reflux disease)   . Inguinal hernia   . Nephrolithiasis   . Reactive airway disease    prior occasional albuterol use, but no definite asthma history    PAST SURGICAL HISTORY: Past Surgical History:  Procedure Laterality Date  . CYSTOSCOPY    . SKIN BIOPSY    . TONSILLECTOMY      FAMILY HISTORY: The patient family history includes Cancer in his maternal grandfather; Cancer (age of onset: 53) in his paternal grandfather; Diabetes in his maternal grandmother; Heart disease (age of onset: 57) in his maternal grandfather; Hyperlipidemia in his mother; Hypertension in his father and mother; Stroke in his mother; Thyroid disease in his sister.   SOCIAL HISTORY:  The patient  reports that he has never smoked. He has never used smokeless tobacco. He reports current alcohol use. He reports that he does not use drugs.  REVIEW OF SYSTEMS: Review of Systems  Constitution: Negative for chills and fever.  HENT: Negative for ear discharge, ear pain and nosebleeds.   Eyes: Negative for blurred vision and discharge.  Cardiovascular: Positive for chest pain and palpitations. Negative for claudication, dyspnea on exertion, leg swelling, near-syncope, orthopnea, paroxysmal nocturnal dyspnea and syncope.  Respiratory: Negative for cough and shortness of breath.   Endocrine: Negative for polydipsia, polyphagia and polyuria.  Hematologic/Lymphatic: Negative for bleeding problem.  Skin: Negative for flushing and nail changes.  Musculoskeletal: Positive for arthritis and joint pain. Negative for muscle cramps, muscle weakness and myalgias.  Gastrointestinal: Negative for abdominal pain, dysphagia, hematemesis, hematochezia, melena, nausea and vomiting.  Neurological: Negative for dizziness, focal weakness and light-headedness.   PHYSICAL EXAM: Vitals with BMI 03/29/2020 03/08/2020 01/17/2019    Height 5\' 6"  5\' 6"  5\' 6"   Weight 164 lbs 163 lbs 3 oz 168 lbs  BMI 26.48 66.06 30.16  Systolic 010 932 355  Diastolic 87 86 80  Pulse 83 88 60   CONSTITUTIONAL: Well-developed and well-nourished. No acute distress.  SKIN: Skin is warm and dry. No rash noted. No cyanosis. No pallor. No jaundice HEAD: Normocephalic and atraumatic.  EYES: No scleral icterus MOUTH/THROAT: Moist oral membranes.  NECK: No JVD present. No thyromegaly noted. No carotid bruits  LYMPHATIC: No visible cervical adenopathy.  CHEST Normal respiratory effort. No intercostal retractions  LUNGS: Clear to auscultation bilaterally.  No stridor. No wheezes. No rales.  CARDIOVASCULAR: Regular rate and rhythm, positive S1-S2, no murmurs rubs or gallops appreciated. ABDOMINAL: No apparent ascites.  EXTREMITIES: No peripheral edema  HEMATOLOGIC: No significant bruising NEUROLOGIC: Oriented to person, place, and time. Nonfocal. Normal muscle tone.  PSYCHIATRIC: Normal mood and affect. Normal behavior. Cooperative  CARDIAC DATABASE: EKG: 03/29/2020: Normal sinus rhythm with incomplete right bundle branch block.  Echocardiogram: None  Stress Testing: None  Heart Catheterization: None  LABORATORY DATA: CBC Latest Ref Rng & Units 03/08/2020 02/14/2018 08/04/2016  WBC 3.4 - 10.8 x10E3/uL 8.0 5.8 6.6  Hemoglobin 13.0 - 17.7 g/dL 14.9 15.2 15.3  Hematocrit 37.5 - 51.0 % 43.3 44.6 45.6  Platelets 150 - 450 x10E3/uL 339 304 290    CMP Latest Ref Rng & Units 03/08/2020 02/14/2018 08/04/2016  Glucose 65 - 99 mg/dL 82 85 81  BUN 6 - 24 mg/dL 17 12 12   Creatinine 0.76 - 1.27 mg/dL 0.86 0.96 0.95  Sodium 134 - 144 mmol/L 142 143 143  Potassium 3.5 - 5.2 mmol/L 4.0 4.1 4.2  Chloride 96 - 106 mmol/L 104 102 103  CO2 20 - 29 mmol/L 24 25 29   Calcium 8.7 - 10.2 mg/dL 9.7 9.7 9.6  Total Protein 6.0 - 8.5 g/dL 6.9 6.8 6.6  Total Bilirubin 0.0 - 1.2 mg/dL 0.4 0.5 0.6  Alkaline Phos 39 - 117 IU/L 59 51 46  AST 0 - 40 IU/L 24 20  19   ALT 0 - 44 IU/L 28 19 17     Lipid Panel     Component Value Date/Time   CHOL 175 03/08/2020 1353   TRIG 65 03/08/2020 1353   HDL 51 03/08/2020 1353   CHOLHDL 3.4 03/08/2020 1353   CHOLHDL 3.3 08/04/2016 0001   VLDL 14 08/04/2016 0001   LDLCALC 112 (H) 03/08/2020 1353   LABVLDL 12 03/08/2020 1353    Lab Results  Component Value Date   HGBA1C 5.5 02/14/2018   HGBA1C 5.2 08/04/2016   No components found for: NTPROBNP Lab Results  Component Value Date   TSH 1.64 08/04/2016    FINAL MEDICATION LIST END OF ENCOUNTER: No orders of the defined types were placed in this encounter.   There are no discontinued medications.   Current Outpatient Medications:  .  Albuterol Sulfate 108 (90 Base) MCG/ACT AEPB, Inhale 2 puffs into the lungs every 6 (six) hours as needed., Disp: 1 each, Rfl: 0 .  aspirin 81 MG tablet, Take 81 mg by mouth daily., Disp: , Rfl:  .  omeprazole (PRILOSEC) 40 MG capsule, Take 1 capsule (  40 mg total) by mouth daily., Disp: 30 capsule, Rfl: 1 .  tamsulosin (FLOMAX) 0.4 MG CAPS capsule, Take 1 capsule (0.4 mg total) by mouth daily after supper., Disp: 30 capsule, Rfl: 1  IMPRESSION:    ICD-10-CM   1. Precordial chest pain  R07.2 EKG 12-Lead    PCV ECHOCARDIOGRAM COMPLETE    PCV CARDIAC STRESS TEST    CT CARDIAC SCORING    SARS-COV-2 RNA,(COVID-19) QUAL NAAT  2. 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 Palpitations  R00.2 EXTERNAL ECG MONITOR (48HRS-7DAYS) REVIEW AND INTERPRETATION     RECOMMENDATIONS: Tyler Nelson is a 49 y.o. male who presents to the office with chief complaint of chest pain.  No significant past cardiac history.  Precordial chest pain:  Patient symptoms of chest discomfort are atypical in nature.  However presents to the office as they have been longstanding and not resolving.  EKG performed and noted above for further reference.  Echocardiogram will be ordered to evaluate for structural heart disease and left ventricular systolic  function.  Plan exercise treadmill stress test to rule out exercise-induced ischemia.  Covid screen prior to GXT.  Coronary artery calcification scoring for further risk stratification.  Most recent lipid profile reviewed.  Noted above.  LDL 111, non-HDL 112, estimated 10-year risk of ASCVD less than 3%.  Educated on the importance of primary prevention of coronary artery disease.  Patient does not have established history of coronary artery disease no clear indication of aspirin 81 mg p.o. daily.  Patient made aware.  Palpitations:  Recommend 48-hour Holter monitor to evaluate for underlying arrhythmic burden.  Orders Placed This Encounter  Procedures  . SARS-COV-2 RNA,(COVID-19) QUAL NAAT  . CT CARDIAC SCORING  . PCV CARDIAC STRESS TEST  . EXTERNAL ECG MONITOR (48HRS-7DAYS) REVIEW AND INTERPRETATION  . EKG 12-Lead  . PCV ECHOCARDIOGRAM COMPLETE   --Continue cardiac medications as reconciled in final medication list. --Return in about 6 weeks (around 05/10/2020) for Discussion of test results. Or sooner if needed. --Continue follow-up with your primary care physician regarding the management of your other chronic comorbid conditions.  Patient's questions and concerns were addressed to his satisfaction. He voices understanding of the instructions provided during this encounter.   During this visit I reviewed and updated: Tobacco history  allergies medication reconciliation  medical history  surgical history  family history  social history.  This note was created using a voice recognition software as a result there may be grammatical errors inadvertently enclosed that do not reflect the nature of this encounter. Every attempt is made to correct such errors.  Tessa Lerner, [place], [facility]  Pager: [phone] Office: [phone]

## 2020-04-20 ENCOUNTER — Ambulatory Visit: Payer: Self-pay

## 2020-04-27 ENCOUNTER — Ambulatory Visit: Payer: Self-pay

## 2020-04-28 ENCOUNTER — Telehealth: Payer: Self-pay | Admitting: *Deleted

## 2020-04-28 NOTE — Telephone Encounter (Signed)
-----   Message from Nyoka Cowden, MD sent at 04/22/2020  1:09 PM EDT ----- Regarding: RE: Chest CT I had rec ct never had it - see if will come to [place] office for f/u ov with plain cxr and if not document it and notify pcp ----- Message ----- From: Donnamae Jude Sent: 08/11/2019  10:29 AM EDT To: Christen Butter, CMA, Nyoka Cowden, MD Subject: Chest CT                                       You put in order for pt to have Chest CT with contrast.  I got him scheduled for 8/31.  Was just able to speak to pt today.  He states he is having financial issues now due to Covid and not able to pay for CT.  He states hasn't even finished paying for previous CT yet  he wants to cancel this CT appt for now and will call us when he can reschedule.  I told him I would make you aware.   Cordelia Pen

## 2020-04-28 NOTE — Telephone Encounter (Signed)
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

## 2020-04-29 ENCOUNTER — Ambulatory Visit: Payer: Self-pay | Attending: Family

## 2020-04-29 DIAGNOSIS — Z23 Encounter for immunization: Secondary | ICD-10-CM

## 2020-04-29 NOTE — Progress Notes (Signed)
   Covid-19 Vaccination Clinic  Name:  Tyler Nelson    MRN: [id number] DOB: 11/13/1971  04/29/2020  Mr. Tyler Nelson was observed post Covid-19 immunization for 15 minutes without incident. He was provided with Vaccine Information Sheet and instruction to access the V-Safe system.   Mr. Tyler Nelson was instructed to call 911 with any severe reactions post vaccine: Marland Kitchen Difficulty breathing  . Swelling of face and throat  . A fast heartbeat  . A bad rash all over body  . Dizziness and weakness   Immunizations Administered    Name Date Dose VIS Date Route   Moderna COVID-19 Vaccine 04/29/2020  4:02 PM 0.5 mL 11/2019 Intramuscular   Manufacturer: Moderna   Lot: [id number]   NDC: 03403-524-81

## 2020-10-13 IMAGING — DX DG CHEST 2V
2 series · 2 of 2 positions shown · non-contrast
Comparison: Chest x-ray 03/08/2011.

CLINICAL DATA: Followup right pleural mass or pleural thickening.

EXAM:
CHEST - 2 VIEW

[chest pa]
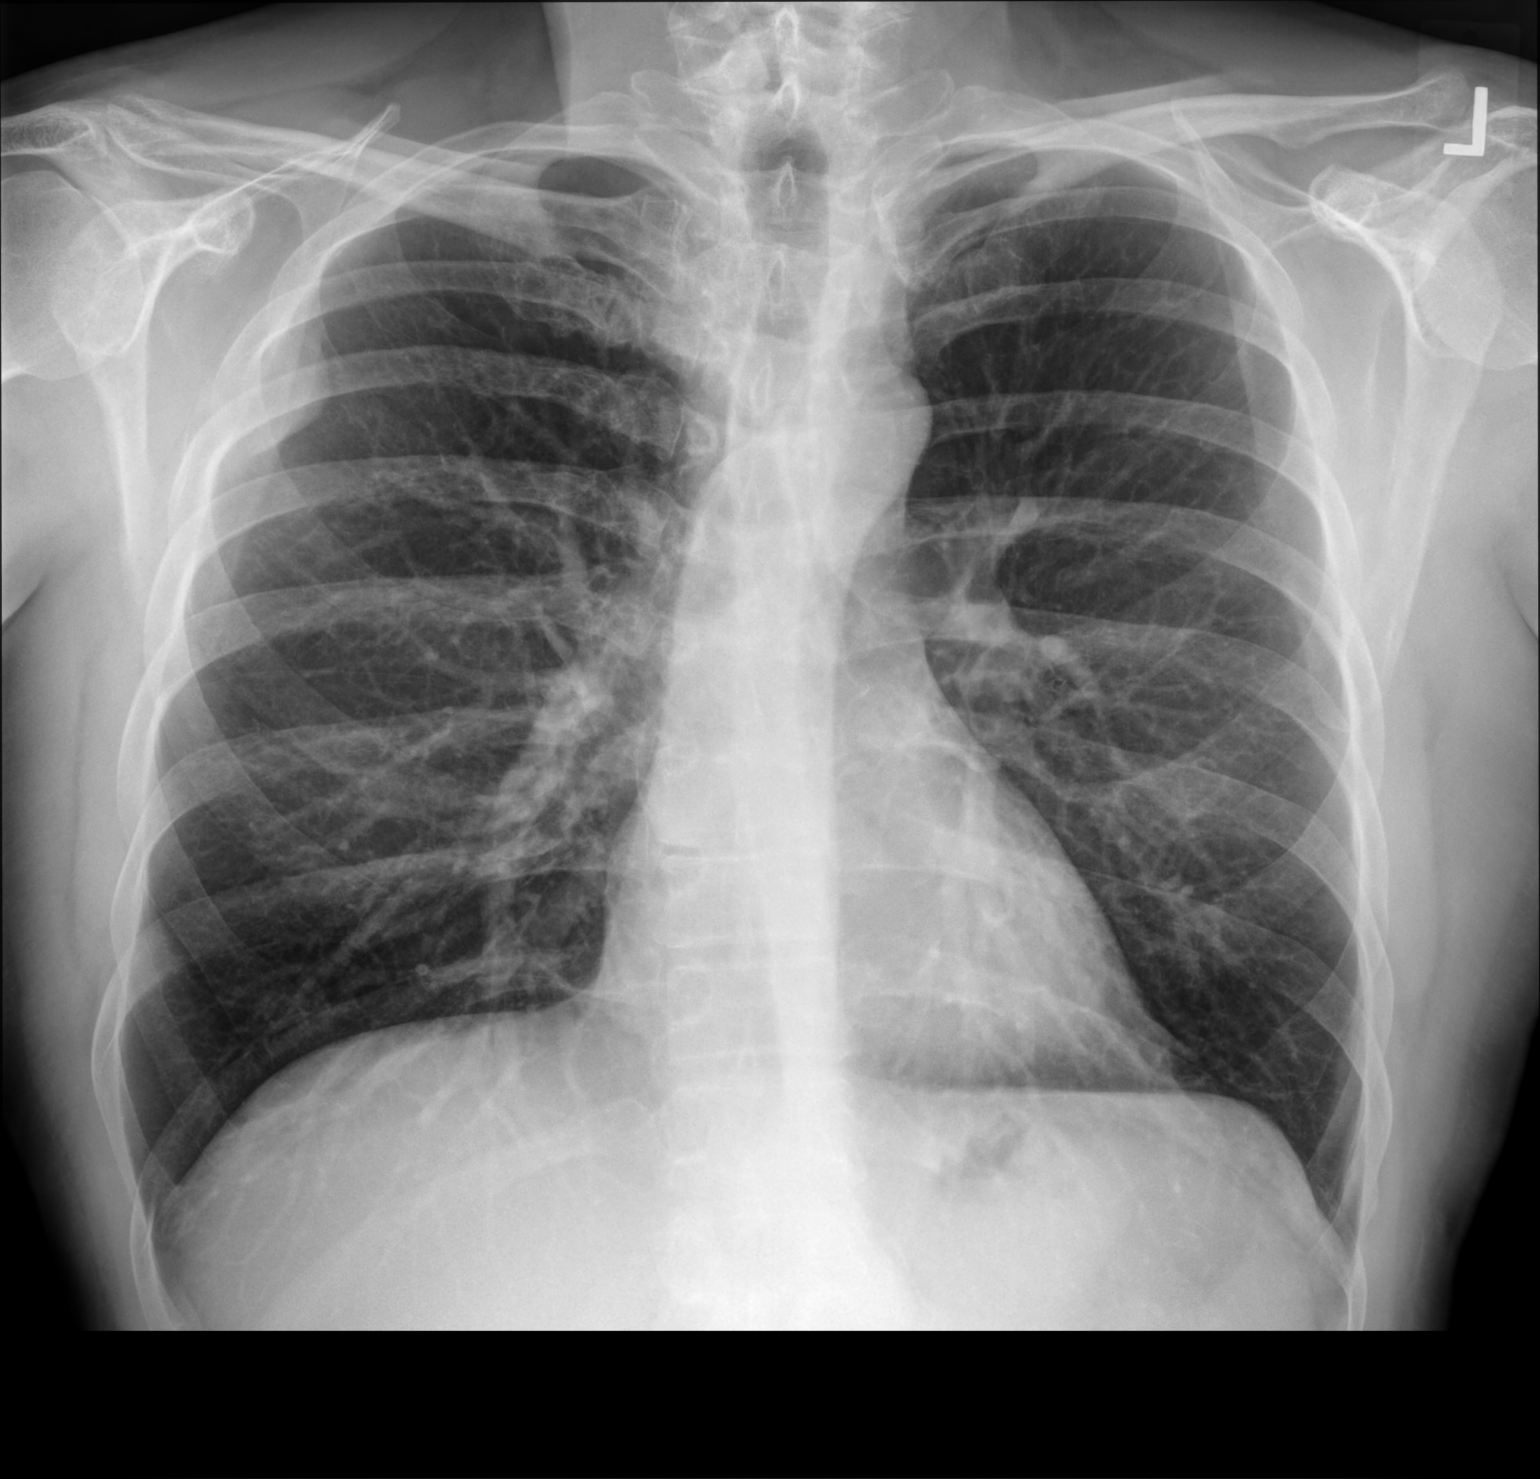

[chest lat]
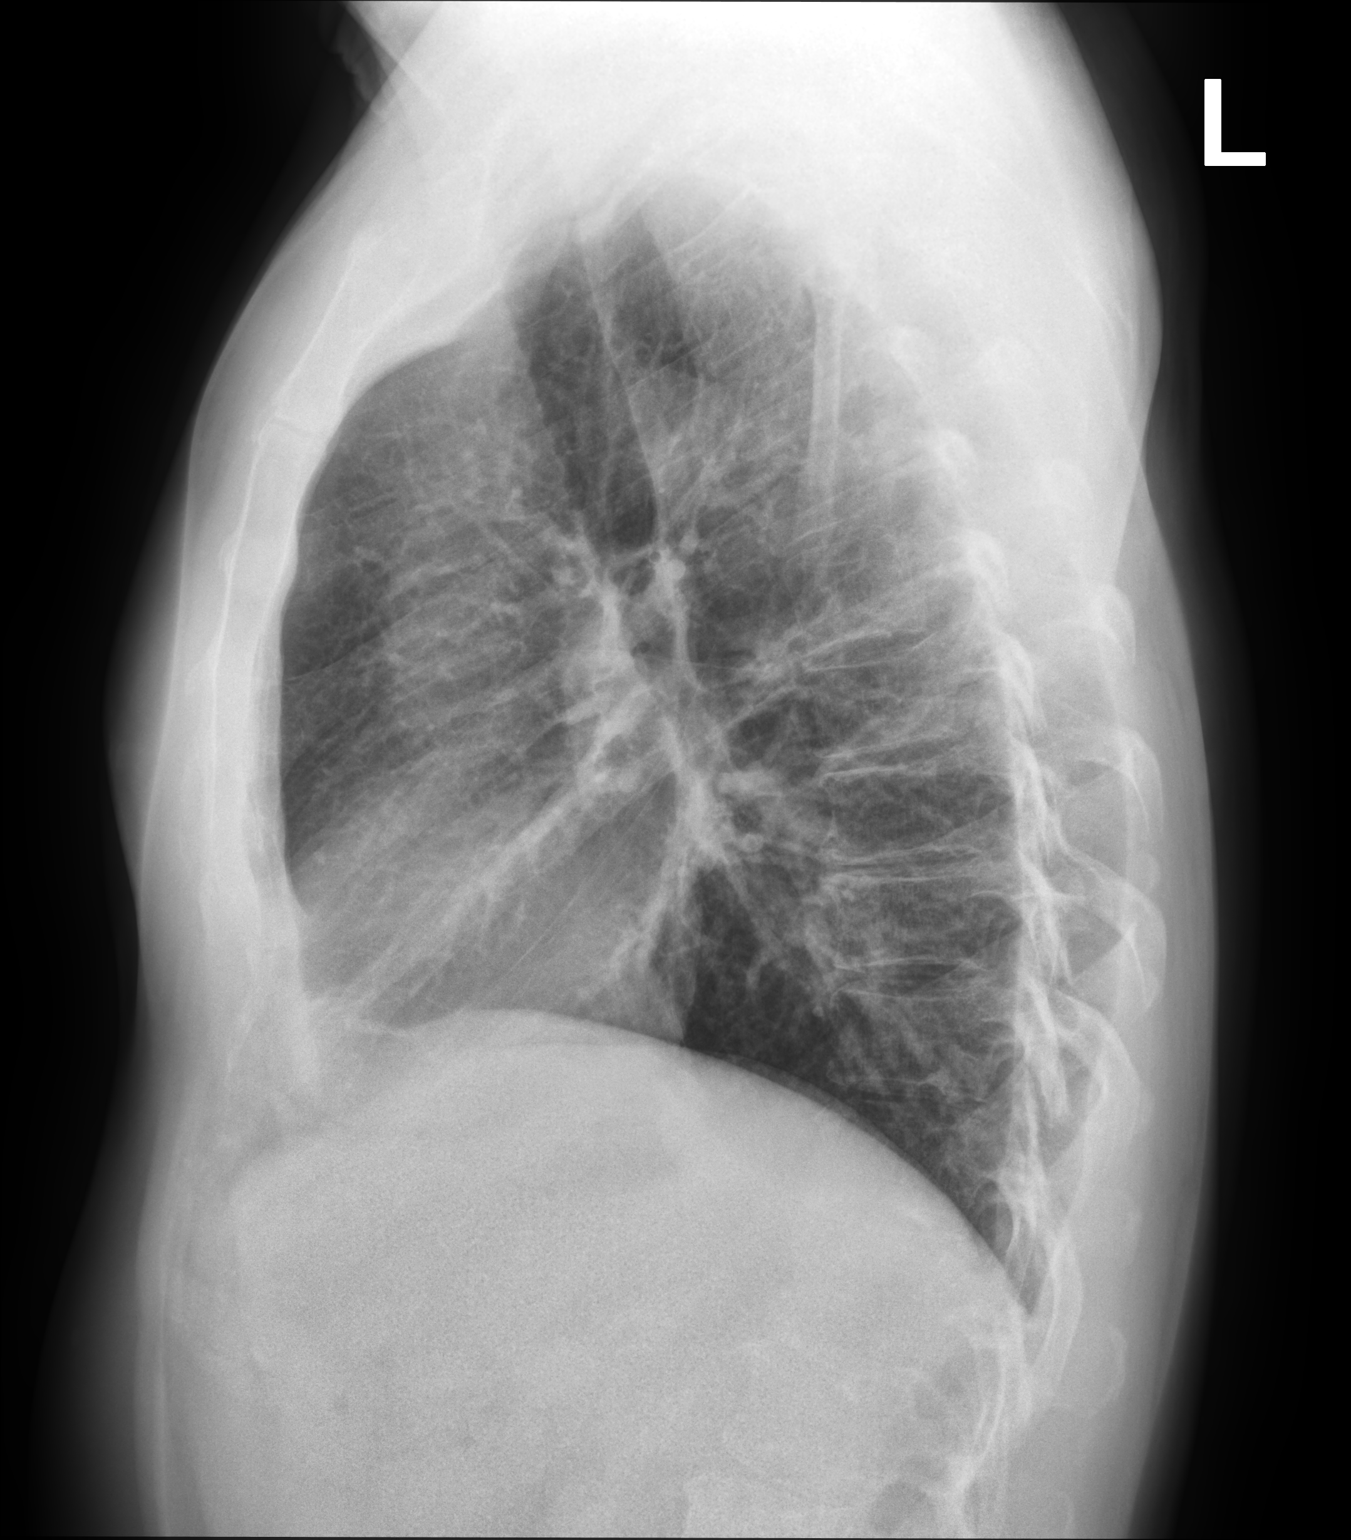

[2 of 2 positions shown; findings below may reference images not displayed]

FINDINGS: The cardiac silhouette, mediastinal and hilar contours are normal.
The lungs are clear. No pleural effusion. There is a focal area of
density in the right upper lateral chest. This could be callus
formation related to an old rib fracture given its density. I do not
however have any recent prior studies available for comparison.
Recommend direct comparison. If this is not stable, a chest CT may
be helpful for further evaluation.
IMPRESSION: 1. Focal peripheral density in the right upper lateral chest,
possibly callus formation related to a prior rib fracture. Recommend
comparison with any prior recent studies to establish stability. If
none are available a chest CT may be helpful.
2. No acute pulmonary findings.

## 2020-12-03 ENCOUNTER — Ambulatory Visit: Payer: Self-pay | Attending: Family

## 2020-12-03 DIAGNOSIS — Z23 Encounter for immunization: Secondary | ICD-10-CM

## 2021-04-08 NOTE — Progress Notes (Signed)
   Covid-19 Vaccination Clinic  Name:  Tyler Nelson    MRN: [id number] DOB: 1970/12/20  04/08/2021  [name] was observed post Covid-19 immunization for 15 minutes without incident. He was provided with Vaccine Information Sheet and instruction to access the V-Safe system.   [name] was instructed to call 911 with any severe reactions post vaccine: Marland Kitchen Difficulty breathing  . Swelling of face and throat  . A fast heartbeat  . A bad rash all over body  . Dizziness and weakness   Immunizations Administered    Name Date Dose VIS Date Route   Pfizer COVID-19 Vaccine 12/03/2020 10:30 AM 0.3 mL 10/06/2020 Intramuscular   Manufacturer: [employer], [employer]   Lot: [id number]   NDC: [address]-6

## 2021-12-08 ENCOUNTER — Ambulatory Visit: Payer: Self-pay | Attending: Family

## 2021-12-08 DIAGNOSIS — Z23 Encounter for immunization: Secondary | ICD-10-CM

## 2021-12-08 NOTE — Progress Notes (Signed)
° °  Covid-19 Vaccination Clinic  Name:  Karlton Maya    MRN: [id number] DOB: [birth year]  12/08/2021  Mr. Kudrna was observed post Covid-19 immunization for 15 minutes without incident. He was provided with Vaccine Information Sheet and instruction to access the V-Safe system.   Mr. Frith was instructed to call 911 with any severe reactions post vaccine: Difficulty breathing  Swelling of face and throat  A fast heartbeat  A bad rash all over body  Dizziness and weakness   Immunizations Administered     Name Date Dose VIS Date Route   Moderna Covid-19 vaccine Bivalent Booster 12/08/2021  9:00 AM 0.5 mL 07/30/2021 Intramuscular   Manufacturer: Moderna   Lot: [id number]   NDC: 30160-109-32

## 2022-01-19 ENCOUNTER — Ambulatory Visit (INDEPENDENT_AMBULATORY_CARE_PROVIDER_SITE_OTHER): Payer: 59 | Admitting: Medical

## 2022-01-19 ENCOUNTER — Other Ambulatory Visit: Payer: Self-pay

## 2022-01-19 ENCOUNTER — Encounter: Payer: Self-pay | Admitting: Medical

## 2022-01-19 VITALS — BP 128/88 | HR 85 | Ht 66.5 in | Wt 164.8 lb

## 2022-01-19 DIAGNOSIS — Z0000 Encounter for general adult medical examination without abnormal findings: Secondary | ICD-10-CM | POA: Diagnosis not present

## 2022-01-19 DIAGNOSIS — Z125 Encounter for screening for malignant neoplasm of prostate: Secondary | ICD-10-CM | POA: Diagnosis not present

## 2022-01-19 DIAGNOSIS — Z87442 Personal history of urinary calculi: Secondary | ICD-10-CM | POA: Diagnosis not present

## 2022-01-19 DIAGNOSIS — L989 Disorder of the skin and subcutaneous tissue, unspecified: Secondary | ICD-10-CM

## 2022-01-19 DIAGNOSIS — K219 Gastro-esophageal reflux disease without esophagitis: Secondary | ICD-10-CM

## 2022-01-19 DIAGNOSIS — Z13220 Encounter for screening for lipoid disorders: Secondary | ICD-10-CM

## 2022-01-19 DIAGNOSIS — H9311 Tinnitus, right ear: Secondary | ICD-10-CM | POA: Insufficient documentation

## 2022-01-19 DIAGNOSIS — J4520 Mild intermittent asthma, uncomplicated: Secondary | ICD-10-CM

## 2022-01-19 DIAGNOSIS — J45909 Unspecified asthma, uncomplicated: Secondary | ICD-10-CM | POA: Insufficient documentation

## 2022-01-19 MED ORDER — ALBUTEROL SULFATE 108 (90 BASE) MCG/ACT IN AEPB: 2.0000 | INHALATION_SPRAY | Freq: Four times a day (QID) | RESPIRATORY_TRACT | 1 refills | Status: DC | PRN

## 2022-01-19 NOTE — Progress Notes (Signed)
Subjective:   HPI  Tyler Nelson is a 51 y.o. male who presents for Chief Complaint  Patient presents with   cpe    Fasting cpe, no concerns. Declines flu shot today    Patient Care Team: Nazyia Gaugh, Cleda Mccreedy as PCP - General (Family Medicine) Sees dentist Sees eye doctor Dr. Tessa Lerner, cardiology Dr. Sandrea Hughs, pulmonology Dr. Jerilee Field   Concerns: Occasionally gets ringing in the right ear.  Not daily.  No concern for hearing loss.  This has been going on for a while  Otherwise normal state of health   Reviewed their medical, surgical, family, social, medication, and allergy history and updated chart as appropriate.  Past Medical History:  Diagnosis Date   Asthma    GERD (gastroesophageal reflux disease)    Inguinal hernia    Nephrolithiasis    Reactive airway disease    prior occasional albuterol use, but no definite asthma history    Past Surgical History:  Procedure Laterality Date   CYSTOSCOPY     SKIN BIOPSY     TONSILLECTOMY      Social History   Socioeconomic History   Marital status: Divorced    Spouse name: Not on file   Number of children: 3   Years of education: Not on file   Highest education level: Not on file  Occupational History   Not on file  Tobacco Use   Smoking status: Never   Smokeless tobacco: Never  Vaping Use   Vaping Use: Never used  Substance and Sexual Activity   Alcohol use: Yes    Alcohol/week: 0.0 standard drinks    Comment: occasional   Drug use: No   Sexual activity: Not on file  Other Topics Concern   Not on file  Social History Narrative   Separated, has 3 adopted kids from [place].  Exercise - walking, always on the move.  Works at [employer], Arts development officer, Art gallery manager.   Prior was in Geographical information systems officer.    01/2022   Social Determinants of Health   Financial Resource Strain: Not on file  Food Insecurity: Not on file  Transportation Needs: Not on file  Physical Activity: Not on file  Stress:  Not on file  Social Connections: Not on file  Intimate Partner Violence: Not on file    Family History  Problem Relation Age of Onset   Hyperlipidemia Mother    Hypertension Mother    Stroke Mother    Hypertension Father    Diabetes Maternal Grandmother    Cancer Maternal Grandfather        leukemia   Heart disease Maternal Grandfather 70   Cancer Paternal Grandfather 17       prostate   Thyroid disease Sister      Current Outpatient Medications:    aspirin 81 MG tablet, Take 81 mg by mouth daily., Disp: , Rfl:    omeprazole (PRILOSEC) 40 MG capsule, Take 1 capsule (40 mg total) by mouth daily., Disp: 30 capsule, Rfl: 1   Albuterol Sulfate (PROAIR RESPICLICK) 108 (90 Base) MCG/ACT AEPB, Inhale 2 puffs into the lungs every 6 (six) hours as needed., Disp: 1 each, Rfl: 1  Allergies  Allergen Reactions   Codeine       Review of Systems Constitutional: -fever, -chills, -sweats, -unexpected weight change, -decreased appetite, -fatigue Allergy: -sneezing, -itching, -congestion Dermatology: -changing moles, --rash, -lumps ENT: -runny nose, -ear pain, -sore throat, -hoarseness, -sinus pain, -teeth pain, + ringing in ears, -hearing loss, -nosebleeds  Cardiology: -chest pain, -palpitations, -swelling, -difficulty breathing when lying flat, -waking up short of breath Respiratory: -cough, -shortness of breath, -difficulty breathing with exercise or exertion, -wheezing, -coughing up blood Gastroenterology: -abdominal pain, -nausea, -vomiting, -diarrhea, -constipation, -blood in stool, -changes in bowel movement, -difficulty swallowing or eating Hematology: -bleeding, -bruising  Musculoskeletal: -joint aches, -muscle aches, -joint swelling, -back pain, -neck pain, -cramping, -changes in gait Ophthalmology: denies vision changes, eye redness, itching, discharge Urology: -burning with urination, -difficulty urinating, -blood in urine, -urinary frequency, -urgency, -incontinence Neurology:  -headache, -weakness, -tingling, -numbness, -memory loss, -falls, -dizziness Psychology: -depressed mood, -agitation, -sleep problems Male GU: no testicular mass, pain, no lymph nodes swollen, no swelling, no rash.     Objective:  BP 128/88    Pulse 85    Ht 5' 6.5" (1.689 m)    Wt 164 lb 12.8 oz (74.8 kg)    BMI 26.20 kg/m   General appearance: alert, no distress, WD/WN, Caucasian male Skin: Right frontal scalp with a 3 mm x 4 mm roundish somewhat pearly skin lesion that is a little bit asymmetrical, otherwise scattered macules, no worrisome lesions Neck: supple, no lymphadenopathy, no thyromegaly, no masses, normal ROM, no bruits Chest: non tender, normal shape and expansion Heart: RRR, normal S1, S2, no murmurs Lungs: CTA bilaterally, no wheezes, rhonchi, or rales Abdomen: +bs, soft, non tender, non distended, no masses, no hepatomegaly, no splenomegaly, no bruits Back: non tender, normal ROM, no scoliosis Musculoskeletal: Legs nontender, normal range of motion, otherwise upper extremities non tender, no obvious deformity, normal ROM throughout, lower extremities non tender, no obvious deformity, normal ROM throughout Extremities: no edema, no cyanosis, no clubbing Pulses: 2+ symmetric, upper and lower extremities, normal cap refill Neurological: alert, oriented x 3, CN2-12 intact, strength normal upper extremities and lower extremities, sensation normal throughout, DTRs 2+ throughout, no cerebellar signs, gait normal Psychiatric: normal affect, behavior normal, pleasant  GU: normal male external genitalia,circumcised, nontender, no masses, no hernia, no lymphadenopathy Rectal: deferred   Assessment and Plan :   Encounter Diagnoses  Name Primary?   Encounter for health maintenance examination in adult Yes   History of kidney stones    Gastroesophageal reflux disease, unspecified whether esophagitis present    Screening for prostate cancer    Screening for lipid disorders    Skin  lesion    Mild intermittent asthma, unspecified whether complicated    Tinnitus of right ear     This visit was a preventative care visit, also known as wellness visit or routine physical.   Topics typically include healthy lifestyle, diet, exercise, preventative care, vaccinations, sick and well care, proper use of emergency dept and after hours care, as well as other concerns.     Recommendations: Continue to return yearly for your annual wellness and preventative care visits.  This gives us a chance to discuss healthy lifestyle, exercise, vaccinations, review your chart record, and perform screenings where appropriate.  I recommend you see your eye doctor yearly for routine vision care.  I recommend you see your dentist yearly for routine dental care including hygiene visits twice yearly.   Vaccination recommendations were reviewed Immunization History  Administered Date(s) Administered   Influenza,inj,Quad PF,6+ Mos 08/04/2016   Moderna Covid-19 Vaccine Bivalent Booster 1953yrs & up 12/08/2021   Moderna Sars-Covid-2 Vaccination 03/18/2020, 04/29/2020   PFIZER(Purple Top)SARS-COV-2 Vaccination 12/03/2020    Shingles vaccine:  I recommend you have a shingles vaccine to help prevent shingles or herpes zoster outbreak.   Please call your  insurer to inquire about coverage for the Shingrix vaccine given in 2 doses.   Some insurers cover this vaccine after age 31, some cover this after age 76.  If your insurer covers this, then call to schedule appointment to have this vaccine here.  I recommend an updated tetanus booster  I recommend a yearly flu shot  Consider pneumococcal vaccine   Screening for cancer: Colon cancer screening: Consider Cologuard test versus colonoscopy.  Please call your insurance company to check coverage for colon cancer screening.  Options may include Cologard stool test or Colonoscopy.  You should also inquire about which facility the colonoscopy could be  performed, and coverage for diagnostic vs screening colonoscopy as coverage may vary.  If you have significant family history of colon cancer or blood in the stool, then you should only do the colonoscopy, not the cologard test.  We discussed PSA, prostate exam, and prostate cancer screening risks/benefits.     Skin cancer screening: Check your skin regularly for new changes, growing lesions, or other lesions of concern Come in for evaluation if you have skin lesions of concern.  Lung cancer screening: If you have a greater than 20 pack year history of tobacco use, then you may qualify for lung cancer screening with a chest CT scan.   Please call your insurance company to inquire about coverage for this test.  We currently don't have screenings for other cancers besides breast, cervical, colon, and lung cancers.  If you have a strong family history of cancer or have other cancer screening concerns, please let me know.    Bone health: Get at least 150 minutes of aerobic exercise weekly Get weight bearing exercise at least once weekly Bone density test:  A bone density test is an imaging test that uses a type of X-ray to measure the amount of calcium and other minerals in your bones. The test may be used to diagnose or screen you for a condition that causes weak or thin bones (osteoporosis), predict your risk for a broken bone (fracture), or determine how well your osteoporosis treatment is working. The bone density test is recommended for females 65 and older, or females or males <65 if certain risk factors such as thyroid disease, long term use of steroids such as for asthma or rheumatological issues, vitamin D deficiency, estrogen deficiency, family history of osteoporosis, self or family history of fragility fracture in first degree relative.    Heart health: Get at least 150 minutes of aerobic exercise weekly Limit alcohol It is important to maintain a healthy blood pressure and healthy  cholesterol numbers  Heart disease screening: Screening for heart disease includes screening for blood pressure, fasting lipids, glucose/diabetes screening, BMI height to weight ratio, reviewed of smoking status, physical activity, and diet.    Goals include blood pressure 120/80 or less, maintaining a healthy lipid/cholesterol profile, preventing diabetes or keeping diabetes numbers under good control, not smoking or using tobacco products, exercising most days per week or at least 150 minutes per week of exercise, and eating healthy variety of fruits and vegetables, healthy oils, and avoiding unhealthy food choices like fried food, fast food, high sugar and high cholesterol foods.    Other tests may possibly include EKG test, CT coronary calcium score, echocardiogram, exercise treadmill stress test.    Medical care options: I recommend you continue to seek care here first for routine care.  We try really hard to have available appointments Monday through Friday daytime hours for sick  visits, acute visits, and physicals.  Urgent care should be used for after hours and weekends for significant issues that cannot wait till the next day.  The emergency department should be used for significant potentially life-threatening emergencies.  The emergency department is expensive, can often have long wait times for less significant concerns, so try to utilize primary care, urgent care, or telemedicine when possible to avoid unnecessary trips to the emergency department.  Virtual visits and telemedicine have been introduced since the pandemic started in 2020, and can be convenient ways to receive medical care.  We offer virtual appointments as well to assist you in a variety of options to seek medical care.    Separate significant issues discussed: Skin lesion-referral to dermatology for right facial lesion as well as general skin surveillance  Asthma-no recent issues, refill inhaler for as needed  use  GERD-no recent problems  Tinnitus-normal hearing screen today.  Consider ENT consult   Tyler Nelson was seen today for cpe.  Diagnoses and all orders for this visit:  Encounter for health maintenance examination in adult -     Comprehensive metabolic panel -     CBC -     Lipid panel -     PSA -     Urinalysis, Routine w reflex microscopic  History of kidney stones  Gastroesophageal reflux disease, unspecified whether esophagitis present  Screening for prostate cancer -     PSA  Screening for lipid disorders -     Lipid panel  Skin lesion -     Ambulatory referral to Dermatology  Mild intermittent asthma, unspecified whether complicated  Tinnitus of right ear  Other orders -     Albuterol Sulfate (PROAIR RESPICLICK) 108 (90 Base) MCG/ACT AEPB; Inhale 2 puffs into the lungs every 6 (six) hours as needed.    Follow-up pending labs, yearly for physical

## 2022-01-20 LAB — CBC
Hematocrit: 43.6 % (ref 37.5–51.0)
Hemoglobin: 15.1 g/dL (ref 13.0–17.7)
MCH: 32.1 pg (ref 26.6–33.0)
MCHC: 34.6 g/dL (ref 31.5–35.7)
MCV: 93 fL (ref 79–97)
Platelets: 339 10*3/uL (ref 150–450)
RBC: 4.7 x10E6/uL (ref 4.14–5.80)
RDW: 12.8 % (ref 11.6–15.4)
WBC: 6.5 10*3/uL (ref 3.4–10.8)

## 2022-01-20 LAB — COMPREHENSIVE METABOLIC PANEL
ALT: 16 IU/L (ref 0–44)
AST: 18 IU/L (ref 0–40)
Albumin/Globulin Ratio: 1.7 (ref 1.2–2.2)
Albumin: 3.7 g/dL — ABNORMAL LOW (ref 4.0–5.0)
Alkaline Phosphatase: 47 IU/L (ref 44–121)
BUN/Creatinine Ratio: 14 (ref 9–20)
BUN: 12 mg/dL (ref 6–24)
Bilirubin Total: 0.4 mg/dL (ref 0.0–1.2)
CO2: 27 mmol/L (ref 20–29)
Calcium: 9.1 mg/dL (ref 8.7–10.2)
Chloride: 103 mmol/L (ref 96–106)
Creatinine, Ser: 0.84 mg/dL (ref 0.76–1.27)
Globulin, Total: 2.2 g/dL (ref 1.5–4.5)
Glucose: 78 mg/dL (ref 70–99)
Potassium: 4.3 mmol/L (ref 3.5–5.2)
Sodium: 142 mmol/L (ref 134–144)
Total Protein: 5.9 g/dL — ABNORMAL LOW (ref 6.0–8.5)
eGFR: 106 mL/min/{1.73_m2} (ref >59)

## 2022-01-20 LAB — URINALYSIS, ROUTINE W REFLEX MICROSCOPIC
Bilirubin, UA: NEGATIVE
Glucose, UA: NEGATIVE
Ketones, UA: NEGATIVE
Leukocytes,UA: NEGATIVE
Nitrite, UA: NEGATIVE
Protein,UA: NEGATIVE
RBC, UA: NEGATIVE
Specific Gravity, UA: 1.008 (ref 1.005–1.030)
Urobilinogen, Ur: 0.2 mg/dL (ref 0.2–1.0)
pH, UA: 7.5 (ref 5.0–7.5)

## 2022-01-20 LAB — PSA: Prostate Specific Ag, Serum: 0.9 ng/mL (ref 0.0–4.0)

## 2022-01-20 LAB — LIPID PANEL
Chol/HDL Ratio: 3.6 ratio (ref 0.0–5.0)
Cholesterol, Total: 196 mg/dL (ref 100–199)
HDL: 54 mg/dL (ref >39)
LDL Chol Calc (NIH): 129 mg/dL — ABNORMAL HIGH (ref 0–99)
Triglycerides: 73 mg/dL (ref 0–149)
VLDL Cholesterol Cal: 13 mg/dL (ref 5–40)

## 2022-03-01 ENCOUNTER — Other Ambulatory Visit: Payer: Self-pay | Admitting: Internal Medicine

## 2022-03-01 DIAGNOSIS — Z1211 Encounter for screening for malignant neoplasm of colon: Secondary | ICD-10-CM

## 2022-03-20 ENCOUNTER — Telehealth: Payer: Self-pay | Admitting: Family Medicine

## 2022-03-20 NOTE — Telephone Encounter (Signed)
Patient called for date of Derm appt.  It shows [facility] on [address], they have not given [place] the appt date yet.  Advised patient he can call Toma Copier for the date.  He said ok. ?

## 2022-03-23 LAB — COLOGUARD: COLOGUARD: NEGATIVE

## 2022-04-26 ENCOUNTER — Telehealth: Payer: Self-pay | Admitting: Internal Medicine

## 2022-04-26 NOTE — Telephone Encounter (Signed)
Pt called and states that he feels his bp goes up with he gets anxious and wants to know if there is anything he can do or OTC to help calm his bp done. He doesn't have blood pressure issues but just notices it goes up with he feels he is like bringing a panic attack on. He doesn't want to take prescriptions medication ?

## 2023-08-13 ENCOUNTER — Ambulatory Visit: Payer: No Typology Code available for payment source | Admitting: Medical

## 2023-08-13 NOTE — Progress Notes (Unsigned)
Of note, patient arrived late, and insurance reviewed determined that he had no coverage.

## 2023-09-14 ENCOUNTER — Encounter: Payer: No Typology Code available for payment source | Admitting: Medical

## 2024-02-12 ENCOUNTER — Ambulatory Visit (INDEPENDENT_AMBULATORY_CARE_PROVIDER_SITE_OTHER): Payer: 59 | Admitting: Medical

## 2024-02-12 ENCOUNTER — Encounter: Payer: Self-pay | Admitting: Medical

## 2024-02-12 VITALS — BP 120/84 | HR 82 | Ht 66.5 in | Wt 172.0 lb

## 2024-02-12 DIAGNOSIS — Z1389 Encounter for screening for other disorder: Secondary | ICD-10-CM

## 2024-02-12 DIAGNOSIS — J4520 Mild intermittent asthma, uncomplicated: Secondary | ICD-10-CM

## 2024-02-12 DIAGNOSIS — Z13220 Encounter for screening for lipoid disorders: Secondary | ICD-10-CM | POA: Diagnosis not present

## 2024-02-12 DIAGNOSIS — Z125 Encounter for screening for malignant neoplasm of prostate: Secondary | ICD-10-CM | POA: Diagnosis not present

## 2024-02-12 DIAGNOSIS — Z7185 Encounter for immunization safety counseling: Secondary | ICD-10-CM

## 2024-02-12 DIAGNOSIS — Z0000 Encounter for general adult medical examination without abnormal findings: Secondary | ICD-10-CM

## 2024-02-12 LAB — POCT URINALYSIS DIP (PROADVANTAGE DEVICE)
Bilirubin, UA: NEGATIVE
Blood, UA: NEGATIVE
Glucose, UA: NEGATIVE mg/dL
Ketones, POC UA: NEGATIVE mg/dL
Leukocytes, UA: NEGATIVE
Nitrite, UA: NEGATIVE
Protein Ur, POC: NEGATIVE mg/dL
Specific Gravity, Urine: 1.02
Urobilinogen, Ur: 0.2
pH, UA: 6 (ref 5.0–8.0)

## 2024-02-12 LAB — LIPID PANEL

## 2024-02-12 MED ORDER — ALBUTEROL SULFATE 108 (90 BASE) MCG/ACT IN AEPB: 2.0000 | INHALATION_SPRAY | Freq: Four times a day (QID) | RESPIRATORY_TRACT | 1 refills | Status: AC | PRN

## 2024-02-12 NOTE — Progress Notes (Signed)
 Subjective:   HPI  Tyler Nelson is a 53 y.o. male who presents for Chief Complaint  Patient presents with   Annual Exam    Fasting annual exam, no new complaints. Declined flu and covid. No record of Tdap in [facility].     Patient Care Team: Tamaya Pun, Cleda Mccreedy as PCP - General (Family Medicine) Sees dentist Sees eye doctor Dr. Tessa Lerner, cardiology Dr. Sandrea Hughs, pulmonology Dr. Jerilee Field, urology   Concerns: Doing fine, no issues.   Reviewed their medical, surgical, family, social, medication, and allergy history and updated chart as appropriate.  Past Medical History:  Diagnosis Date   Asthma    GERD (gastroesophageal reflux disease)    Inguinal hernia    Nephrolithiasis    Reactive airway disease    prior occasional albuterol use, but no definite asthma history    Past Surgical History:  Procedure Laterality Date   CYSTOSCOPY     SKIN BIOPSY     TONSILLECTOMY      Social History   Socioeconomic History   Marital status: Divorced    Spouse name: Not on file   Number of children: 3   Years of education: Not on file   Highest education level: Not on file  Occupational History   Not on file  Tobacco Use   Smoking status: Never   Smokeless tobacco: Never  Vaping Use   Vaping status: Never Used  Substance and Sexual Activity   Alcohol use: Yes    Comment: occasional   Drug use: No   Sexual activity: Not on file  Other Topics Concern   Not on file  Social History Narrative   Separated, has 3 adopted kids from [place].  Exercise - walking, always on the move.  Works at [employer], Arts development officer, Art gallery manager.   Prior was in Geographical information systems officer.    01/2024   Social Drivers of Health   Financial Resource Strain: Patient Unable To Answer (02/12/2024)   Overall Financial Resource Strain (CARDIA)    Difficulty of Paying Living Expenses: Patient unable to answer  Food Insecurity: Patient Declined (02/12/2024)   Hunger Vital Sign    Worried About  Running Out of Food in the Last Year: Patient declined    Ran Out of Food in the Last Year: Patient declined  Transportation Needs: No Transportation Needs (02/12/2024)   PRAPARE - Administrator, Civil Service (Medical): No    Lack of Transportation (Non-Medical): No  Physical Activity: Unknown (02/12/2024)   Exercise Vital Sign    Days of Exercise per Week: 5 days    Minutes of Exercise per Session: Not on file  Stress: Patient Declined (02/12/2024)   [employer] of Occupational Health - Occupational Stress Questionnaire    Feeling of Stress : Patient declined  Social Connections: Socially Integrated (02/12/2024)   Social Connection and Isolation Panel [NHANES]    Frequency of Communication with Friends and Family: More than three times a week    Frequency of Social Gatherings with Friends and Family: More than three times a week    Attends Religious Services: 1 to 4 times per year    Active Member of [employer] or Organizations: Yes    Attends Banker Meetings: 1 to 4 times per year    Marital Status: Married  Catering manager Violence: Unknown (02/12/2024)   Humiliation, Afraid, Rape, and Kick questionnaire    Fear of Current or Ex-Partner: No    Emotionally Abused: Patient declined  Physically Abused: No    Sexually Abused: No    Family History  Problem Relation Age of Onset   Hyperlipidemia Mother    Hypertension Mother    Stroke Mother    Hypertension Father    Diabetes Maternal Grandmother    Cancer Maternal Grandfather        leukemia   Heart disease Maternal Grandfather 14   Cancer Paternal Grandfather 64       prostate   Thyroid disease Sister      Current Outpatient Medications:    aspirin 81 MG tablet, Take 81 mg by mouth daily., Disp: , Rfl:    Albuterol Sulfate (PROAIR RESPICLICK) 108 (90 Base) MCG/ACT AEPB, Inhale 2 puffs into the lungs every 6 (six) hours as needed., Disp: 1 each, Rfl: 1  Allergies  Allergen Reactions   Codeine       Review of Systems  Constitutional:  Negative for chills, fever, malaise/fatigue and weight loss.  HENT:  Negative for congestion, ear pain, hearing loss, sore throat and tinnitus.   Eyes:  Negative for blurred vision, pain and redness.  Respiratory:  Negative for cough, hemoptysis and shortness of breath.   Cardiovascular:  Negative for chest pain, palpitations, orthopnea, claudication and leg swelling.  Gastrointestinal:  Negative for abdominal pain, blood in stool, constipation, diarrhea, nausea and vomiting.  Genitourinary:  Negative for dysuria, flank pain, frequency, hematuria and urgency.  Musculoskeletal:  Negative for falls, joint pain and myalgias.  Skin:  Negative for itching and rash.  Neurological:  Negative for dizziness, tingling, speech change, weakness and headaches.  Endo/Heme/Allergies:  Negative for polydipsia. Does not bruise/bleed easily.  Psychiatric/Behavioral:  Negative for depression and memory loss. The patient is not nervous/anxious and does not have insomnia.         Objective:  BP 120/84   Pulse 82   Ht 5' 6.5" (1.689 m)   Wt 172 lb (78 kg)   BMI 27.35 kg/m   General appearance: alert, no distress, WD/WN, Caucasian male Skin: left chest wall inferior to breast with 7cm x 5cm brown raised lesion possible seborrheic keratosis,  otherwise scattered macules, no worrisome lesions Neck: supple, no lymphadenopathy, no thyromegaly, no masses, normal ROM, no bruits Chest: non tender, normal shape and expansion Heart: RRR, normal S1, S2, no murmurs Lungs: CTA bilaterally, no wheezes, rhonchi, or rales Abdomen: +bs, soft, non tender, non distended, no masses, no hepatomegaly, no splenomegaly, no bruits Back: non tender, normal ROM, no scoliosis Musculoskeletal: Legs nontender, normal range of motion, otherwise upper extremities non tender, no obvious deformity, normal ROM throughout, lower extremities non tender, no obvious deformity, normal ROM  throughout Extremities: no edema, no cyanosis, no clubbing Pulses: 2+ symmetric, upper and lower extremities, normal cap refill Neurological: alert, oriented x 3, CN2-12 intact, strength normal upper extremities and lower extremities, sensation normal throughout, DTRs 2+ throughout, no cerebellar signs, gait normal Psychiatric: normal affect, behavior normal, pleasant  GU: normal male external genitalia,circumcised, nontender, no masses, no hernia, no lymphadenopathy Rectal: deferred   Assessment and Plan :   Encounter Diagnoses  Name Primary?   Annual physical exam Yes   Screening for prostate cancer    Screening for lipid disorders    Mild intermittent asthma, unspecified whether complicated    Screening for hematuria or proteinuria    Vaccine counseling    This visit was a preventative care visit, also known as wellness visit or routine physical.   Topics typically include healthy lifestyle, diet, exercise, preventative  care, vaccinations, sick and well care, proper use of emergency dept and after hours care, as well as other concerns.     Recommendations: Continue to return yearly for your annual wellness and preventative care visits.  This gives [place] a chance to discuss healthy lifestyle, exercise, vaccinations, review your chart record, and perform screenings where appropriate.  I recommend you see your eye doctor yearly for routine vision care.  I recommend you see your dentist yearly for routine dental care including hygiene visits twice yearly.   Vaccination recommendations were reviewed Immunization History  Administered Date(s) Administered   Influenza,inj,Quad PF,6+ Mos 08/04/2016   Moderna Covid-19 Vaccine Bivalent Booster 54yrs & up 12/08/2021   Moderna Sars-Covid-2 Vaccination 03/18/2020, 04/29/2020   PFIZER(Purple Top)SARS-COV-2 Vaccination 12/03/2020    I recommend an updated tetanus booster, Shingrix, pneumococcal 21.  He declines vaccines    Screening for  cancer: Colon cancer screening: Cologuard up to date 02/2022, negative  We discussed PSA, prostate exam, and prostate cancer screening risks/benefits.     Skin cancer screening: Check your skin regularly for new changes, growing lesions, or other lesions of concern Come in for evaluation if you have skin lesions of concern.  Lung cancer screening: If you have a greater than 20 pack year history of tobacco use, then you may qualify for lung cancer screening with a chest CT scan.   Please call your insurance company to inquire about coverage for this test.  We currently don't have screenings for other cancers besides breast, cervical, colon, and lung cancers.  If you have a strong family history of cancer or have other cancer screening concerns, please let me know.    Bone health: Get at least 150 minutes of aerobic exercise weekly Get weight bearing exercise at least once weekly Bone density test:  A bone density test is an imaging test that uses a type of X-ray to measure the amount of calcium and other minerals in your bones. The test may be used to diagnose or screen you for a condition that causes weak or thin bones (osteoporosis), predict your risk for a broken bone (fracture), or determine how well your osteoporosis treatment is working. The bone density test is recommended for females 65 and older, or females or males <65 if certain risk factors such as thyroid disease, long term use of steroids such as for asthma or rheumatological issues, vitamin D deficiency, estrogen deficiency, family history of osteoporosis, self or family history of fragility fracture in first degree relative.    Heart health: Get at least 150 minutes of aerobic exercise weekly Limit alcohol It is important to maintain a healthy blood pressure and healthy cholesterol numbers  Heart disease screening: Screening for heart disease includes screening for blood pressure, fasting lipids, glucose/diabetes  screening, BMI height to weight ratio, reviewed of smoking status, physical activity, and diet.    Goals include blood pressure 120/80 or less, maintaining a healthy lipid/cholesterol profile, preventing diabetes or keeping diabetes numbers under good control, not smoking or using tobacco products, exercising most days per week or at least 150 minutes per week of exercise, and eating healthy variety of fruits and vegetables, healthy oils, and avoiding unhealthy food choices like fried food, fast food, high sugar and high cholesterol foods.    Other tests may possibly include EKG test, CT coronary calcium score, echocardiogram, exercise treadmill stress test.    Medical care options: I recommend you continue to seek care here first for routine care.  We try  really hard to have available appointments Monday through Friday daytime hours for sick visits, acute visits, and physicals.  Urgent care should be used for after hours and weekends for significant issues that cannot wait till the next day.  The emergency department should be used for significant potentially life-threatening emergencies.  The emergency department is expensive, can often have long wait times for less significant concerns, so try to utilize primary care, urgent care, or telemedicine when possible to avoid unnecessary trips to the emergency department.  Virtual visits and telemedicine have been introduced since the pandemic started in 2020, and can be convenient ways to receive medical care.  We offer virtual appointments as well to assist you in a variety of options to seek medical care.    Separate significant issues discussed: Skin lesion-consider dermatology consult.  He will consider  Asthma-no recent issues, refill inhaler for as needed use  GERD-no recent problems   Tyde was seen today for annual exam.  Diagnoses and all orders for this visit:  Annual physical exam -     POCT Urinalysis DIP (Proadvantage  Device) -     Comprehensive metabolic panel -     CBC -     Lipid panel -     PSA -     TSH  Screening for prostate cancer -     PSA  Screening for lipid disorders -     Lipid panel  Mild intermittent asthma, unspecified whether complicated  Screening for hematuria or proteinuria  Vaccine counseling  Other orders -     Albuterol Sulfate (PROAIR RESPICLICK) 108 (90 Base) MCG/ACT AEPB; Inhale 2 puffs into the lungs every 6 (six) hours as needed.    Follow-up pending labs, yearly for physical

## 2024-02-13 LAB — COMPREHENSIVE METABOLIC PANEL
ALT: 18 IU/L (ref 0–44)
AST: 15 IU/L (ref 0–40)
Albumin: 4.1 g/dL (ref 3.8–4.9)
Alkaline Phosphatase: 51 IU/L (ref 44–121)
BUN/Creatinine Ratio: 23 — ABNORMAL HIGH (ref 9–20)
BUN: 22 mg/dL (ref 6–24)
Bilirubin Total: 0.3 mg/dL (ref 0.0–1.2)
CO2: 25 mmol/L (ref 20–29)
Calcium: 9.5 mg/dL (ref 8.7–10.2)
Chloride: 105 mmol/L (ref 96–106)
Creatinine, Ser: 0.96 mg/dL (ref 0.76–1.27)
Globulin, Total: 2.3 g/dL (ref 1.5–4.5)
Glucose: 82 mg/dL (ref 70–99)
Potassium: 4.1 mmol/L (ref 3.5–5.2)
Sodium: 141 mmol/L (ref 134–144)
Total Protein: 6.4 g/dL (ref 6.0–8.5)
eGFR: 95 mL/min/{1.73_m2} (ref >59)

## 2024-02-13 LAB — LIPID PANEL
Cholesterol, Total: 206 mg/dL — ABNORMAL HIGH (ref 100–199)
HDL: 53 mg/dL (ref >39)
LDL CALC COMMENT:: 3.9 ratio (ref 0.0–5.0)
LDL Chol Calc (NIH): 139 mg/dL — ABNORMAL HIGH (ref 0–99)
Triglycerides: 77 mg/dL (ref 0–149)
VLDL Cholesterol Cal: 14 mg/dL (ref 5–40)

## 2024-02-13 LAB — CBC
Hematocrit: 45.6 % (ref 37.5–51.0)
Hemoglobin: 15.5 g/dL (ref 13.0–17.7)
MCH: 31.9 pg (ref 26.6–33.0)
MCHC: 34 g/dL (ref 31.5–35.7)
MCV: 94 fL (ref 79–97)
Platelets: 302 10*3/uL (ref 150–450)
RBC: 4.86 x10E6/uL (ref 4.14–5.80)
RDW: 12.8 % (ref 11.6–15.4)
WBC: 7.9 10*3/uL (ref 3.4–10.8)

## 2024-02-13 LAB — TSH: TSH: 1.29 u[IU]/mL (ref 0.450–4.500)

## 2024-02-13 LAB — PSA: Prostate Specific Ag, Serum: 1.1 ng/mL (ref 0.0–4.0)

## 2024-02-13 NOTE — Progress Notes (Signed)
 Results sent through MyChart

## 2024-09-09 ENCOUNTER — Telehealth: Payer: Self-pay | Admitting: Internal Medicine

## 2024-09-09 ENCOUNTER — Other Ambulatory Visit: Payer: Self-pay | Admitting: Medical

## 2024-09-09 DIAGNOSIS — L989 Disorder of the skin and subcutaneous tissue, unspecified: Secondary | ICD-10-CM

## 2024-09-09 NOTE — Telephone Encounter (Signed)
 Patient called and would like a referral to dermatology for skin check. I see in your notes this was discussed at last visit in February

## 2024-09-10 NOTE — Telephone Encounter (Signed)
 Referral placed.

## 2024-10-14 ENCOUNTER — Telehealth: Payer: Self-pay | Admitting: Internal Medicine

## 2024-10-14 NOTE — Telephone Encounter (Unsigned)
 Copied from CRM [id number]. Topic: General - Other >> Oct 14, 2024  1:54 PM Rachelle R wrote: Reason for CRM: Patient called requesting to speak to the office directly in regards to a calcium test it shows on his MyChart. Is requesting a callback  Patient can be reached at [phone]

## 2024-10-15 ENCOUNTER — Other Ambulatory Visit: Payer: Self-pay | Admitting: Medical

## 2024-10-15 DIAGNOSIS — Z136 Encounter for screening for cardiovascular disorders: Secondary | ICD-10-CM

## 2024-10-15 NOTE — Telephone Encounter (Signed)
 Pt would like you to go ahead and place CT coronary  test

## 2024-11-21 ENCOUNTER — Ambulatory Visit: Payer: Self-pay | Admitting: Medical

## 2024-11-21 ENCOUNTER — Ambulatory Visit (HOSPITAL_COMMUNITY)
Admission: RE | Admit: 2024-11-21 | Discharge: 2024-11-21 | Disposition: A | Discharge status: Home / Self Care | Payer: Self-pay | Source: Ambulatory Visit | Attending: Medical | Admitting: Medical

## 2024-11-21 DIAGNOSIS — Z136 Encounter for screening for cardiovascular disorders: Secondary | ICD-10-CM

## 2024-11-21 NOTE — Progress Notes (Signed)
 Results thru my chart

## 2025-01-13 ENCOUNTER — Telehealth: Payer: Self-pay | Admitting: Medical

## 2025-01-13 ENCOUNTER — Encounter: Payer: Self-pay | Admitting: Medical

## 2025-01-13 VITALS — Wt 168.0 lb

## 2025-01-13 DIAGNOSIS — R6889 Other general symptoms and signs: Secondary | ICD-10-CM | POA: Diagnosis not present

## 2025-01-13 DIAGNOSIS — R051 Acute cough: Secondary | ICD-10-CM

## 2025-01-13 MED ORDER — OSELTAMIVIR PHOSPHATE 75 MG PO CAPS: 75.0000 mg | ORAL_CAPSULE | Freq: Two times a day (BID) | ORAL | 0 refills | Status: AC

## 2025-01-13 MED ORDER — BENZONATATE 200 MG PO CAPS: 200.0000 mg | ORAL_CAPSULE | Freq: Two times a day (BID) | ORAL | 0 refills | Status: AC | PRN
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
## 2025-02-19 ENCOUNTER — Encounter: Payer: 59 | Admitting: Medical
